# Patient Record
Sex: Female | Born: 2000
Health system: Southern US, Community
[De-identification: ages and names within clinical notes are randomized; demographics above are authoritative.]

## PROBLEM LIST (undated history)

## (undated) DIAGNOSIS — F419 Anxiety disorder, unspecified: Secondary | ICD-10-CM

## (undated) DIAGNOSIS — F909 Attention-deficit hyperactivity disorder, unspecified type: Secondary | ICD-10-CM

## (undated) HISTORY — PX: TYMPANOSTOMY TUBE PLACEMENT: SHX32

## (undated) HISTORY — PX: TONSILLECTOMY AND ADENOIDECTOMY: SUR1326

## (undated) HISTORY — PX: HERNIA REPAIR: SHX51

## (undated) HISTORY — DX: Anxiety disorder, unspecified: F41.9

---
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
## 2022-01-07 DIAGNOSIS — Z30431 Encounter for routine checking of intrauterine contraceptive device: Secondary | ICD-10-CM | POA: Diagnosis not present

## 2022-01-09 DIAGNOSIS — J3089 Other allergic rhinitis: Secondary | ICD-10-CM | POA: Diagnosis not present

## 2022-01-09 DIAGNOSIS — J301 Allergic rhinitis due to pollen: Secondary | ICD-10-CM | POA: Diagnosis not present

## 2022-01-09 DIAGNOSIS — J3081 Allergic rhinitis due to animal (cat) (dog) hair and dander: Secondary | ICD-10-CM | POA: Diagnosis not present

## 2022-01-16 DIAGNOSIS — J301 Allergic rhinitis due to pollen: Secondary | ICD-10-CM | POA: Diagnosis not present

## 2022-01-16 DIAGNOSIS — J3081 Allergic rhinitis due to animal (cat) (dog) hair and dander: Secondary | ICD-10-CM | POA: Diagnosis not present

## 2022-01-16 DIAGNOSIS — J3089 Other allergic rhinitis: Secondary | ICD-10-CM | POA: Diagnosis not present

## 2022-02-04 DIAGNOSIS — J3089 Other allergic rhinitis: Secondary | ICD-10-CM | POA: Diagnosis not present

## 2022-02-04 DIAGNOSIS — J3081 Allergic rhinitis due to animal (cat) (dog) hair and dander: Secondary | ICD-10-CM | POA: Diagnosis not present

## 2022-02-04 DIAGNOSIS — J301 Allergic rhinitis due to pollen: Secondary | ICD-10-CM | POA: Diagnosis not present

## 2022-02-10 DIAGNOSIS — Z01419 Encounter for gynecological examination (general) (routine) without abnormal findings: Secondary | ICD-10-CM | POA: Diagnosis not present

## 2022-02-10 DIAGNOSIS — Z113 Encounter for screening for infections with a predominantly sexual mode of transmission: Secondary | ICD-10-CM | POA: Diagnosis not present

## 2022-02-10 DIAGNOSIS — Z6822 Body mass index (BMI) 22.0-22.9, adult: Secondary | ICD-10-CM | POA: Diagnosis not present

## 2022-02-10 DIAGNOSIS — L709 Acne, unspecified: Secondary | ICD-10-CM | POA: Diagnosis not present

## 2022-03-04 ENCOUNTER — Telehealth: Payer: Self-pay | Admitting: Family Medicine

## 2022-03-04 DIAGNOSIS — Z111 Encounter for screening for respiratory tuberculosis: Secondary | ICD-10-CM

## 2022-03-04 NOTE — Telephone Encounter (Signed)
Patient mother dropped off form to be filled out Patient mother knows that there is a CPE appt in July for daughter  They would like to have it filled out before then so that way they can meet a dead line for a contest with college

## 2022-03-04 NOTE — Telephone Encounter (Signed)
Form has been received. Will she need to come in before July for this to be completed?

## 2022-03-05 NOTE — Telephone Encounter (Signed)
Just needed shot records- has been completed and put up front for pt

## 2022-03-06 DIAGNOSIS — J3081 Allergic rhinitis due to animal (cat) (dog) hair and dander: Secondary | ICD-10-CM | POA: Diagnosis not present

## 2022-03-06 DIAGNOSIS — J3089 Other allergic rhinitis: Secondary | ICD-10-CM | POA: Diagnosis not present

## 2022-03-06 DIAGNOSIS — J301 Allergic rhinitis due to pollen: Secondary | ICD-10-CM | POA: Diagnosis not present

## 2022-03-09 DIAGNOSIS — H6692 Otitis media, unspecified, left ear: Secondary | ICD-10-CM | POA: Diagnosis not present

## 2022-03-19 ENCOUNTER — Other Ambulatory Visit: Payer: Self-pay

## 2022-03-19 ENCOUNTER — Ambulatory Visit (INDEPENDENT_AMBULATORY_CARE_PROVIDER_SITE_OTHER): Payer: BC Managed Care – PPO

## 2022-03-19 ENCOUNTER — Ambulatory Visit: Payer: BC Managed Care – PPO

## 2022-03-19 DIAGNOSIS — Z111 Encounter for screening for respiratory tuberculosis: Secondary | ICD-10-CM

## 2022-03-19 DIAGNOSIS — J3089 Other allergic rhinitis: Secondary | ICD-10-CM | POA: Diagnosis not present

## 2022-03-19 DIAGNOSIS — Z0000 Encounter for general adult medical examination without abnormal findings: Secondary | ICD-10-CM

## 2022-03-19 DIAGNOSIS — J3081 Allergic rhinitis due to animal (cat) (dog) hair and dander: Secondary | ICD-10-CM | POA: Diagnosis not present

## 2022-03-19 DIAGNOSIS — J301 Allergic rhinitis due to pollen: Secondary | ICD-10-CM | POA: Diagnosis not present

## 2022-03-19 NOTE — Addendum Note (Signed)
Addended by: Abbe Amsterdam C on: 03/19/2022 10:02 AM   Modules accepted: Orders

## 2022-03-19 NOTE — Telephone Encounter (Signed)
Patient mother picked up forms and took to college. Looks like they are actually needing a TB test done and is needing it to be done by July1st  Is there anyway we can get this done for them????

## 2022-03-19 NOTE — Telephone Encounter (Signed)
I called her mom- she is coming in right now for a TB skin test

## 2022-03-19 NOTE — Progress Notes (Signed)
PPD Placement note Tamara Wilcox, 21 y.o. female is here today for placement of PPD test Reason for PPD test: Patient mother picked up forms and took to college. Looks like they are actually needing a TB test done and is needing it to be done by July1st Pt taken PPD test before: no Verified in allergy area and with patient that they are not allergic to the products PPD is made of (Phenol or Tween). Yes Is patient taking any oral or IV steroid medication now or have they taken it in the last month? no Has the patient ever received the BCG vaccine?: no Has the patient been in recent contact with anyone known or suspected of having active TB disease?: no      Date of exposure (if applicable):       Name of person they were exposed to (if applicable):  Patient's Country of origin?:  O: Alert and oriented in NAD. P:  PPD placed on 03/19/2022.  Patient advised to return for reading within 48-72 hours.

## 2022-03-21 ENCOUNTER — Ambulatory Visit: Payer: BC Managed Care – PPO

## 2022-03-21 DIAGNOSIS — H9202 Otalgia, left ear: Secondary | ICD-10-CM | POA: Diagnosis not present

## 2022-03-21 DIAGNOSIS — Z111 Encounter for screening for respiratory tuberculosis: Secondary | ICD-10-CM

## 2022-03-21 NOTE — Progress Notes (Signed)
PPD Reading Note PPD read and results entered in [employer]. Result: 0.0 mm induration. Interpretation: Negative If test not read within 48-72 hours of initial placement, patient advised to repeat in other arm 1-3 weeks after this test. Allergic reaction: no   Dr Patsy Lager not in office, Vernona Rieger to [place].

## 2022-03-27 DIAGNOSIS — J3089 Other allergic rhinitis: Secondary | ICD-10-CM | POA: Diagnosis not present

## 2022-03-27 DIAGNOSIS — J301 Allergic rhinitis due to pollen: Secondary | ICD-10-CM | POA: Diagnosis not present

## 2022-03-27 DIAGNOSIS — J3081 Allergic rhinitis due to animal (cat) (dog) hair and dander: Secondary | ICD-10-CM | POA: Diagnosis not present

## 2022-03-31 DIAGNOSIS — J301 Allergic rhinitis due to pollen: Secondary | ICD-10-CM | POA: Diagnosis not present

## 2022-03-31 DIAGNOSIS — J3081 Allergic rhinitis due to animal (cat) (dog) hair and dander: Secondary | ICD-10-CM | POA: Diagnosis not present

## 2022-03-31 DIAGNOSIS — J3089 Other allergic rhinitis: Secondary | ICD-10-CM | POA: Diagnosis not present

## 2022-04-06 NOTE — Progress Notes (Unsigned)
[facility] [address], [address] [place], [place] [address] [phone]  Date:  04/09/2022   Name:  Tamara Wilcox   DOB:  01/07/2001   MRN:  [id number]  PCP:  Pearline Cables, MD    Chief Complaint: No chief complaint on file.   History of Present Illness:  Tamara Wilcox is a 21 y.o. very pleasant female patient who presents with the following:  Patient seen today for physical exam Generally good health, history of ADD treated with Adderall XR Also history of allergies, treated with Singulair  I saw her most recently about 1 year ago, earlier this summer completed her college physical form She graduated from [employer] high school She has seen cardiology previously for history of vasovagal presyncope, here for full activity  Review immunizations  Patient Active Problem List   Diagnosis Date Noted   Acne vulgaris 08/12/2021   Complication of right ear piercing 08/12/2021   Encounter for immunization 08/12/2021   Lesion of skin of right ear 08/12/2021   Viral warts 08/12/2021   Sinus tachycardia 08/12/2021   Cardiac murmur 08/12/2021   History of recurrent ear infection 11/15/2020   Recurrent epistaxis 11/15/2020   Allergic rhinitis 04/24/2016   Gastroesophageal reflux disease 01/02/2016   Oppositional defiant disorder 10/29/2015   Anxiety 10/29/2015   Attention deficit disorder with hyperactivity(314.01) 09/02/2013    Past Medical History:  Diagnosis Date   Allergic rhinitis 04/24/2016   Anxiety    Attention deficit disorder with hyperactivity(314.01) 09/02/2013   Gastroesophageal reflux disease 01/02/2016   History of recurrent ear infection 11/15/2020   Oppositional defiant disorder 10/29/2015   Recurrent epistaxis 11/15/2020    Past Surgical History:  Procedure Laterality Date   HERNIA REPAIR     TONSILLECTOMY AND ADENOIDECTOMY     TYMPANOSTOMY TUBE PLACEMENT      Social History   Tobacco Use    Smoking status: Never   Smokeless tobacco: Never  Substance Use Topics   Alcohol use: No    Alcohol/week: 0.0 standard drinks of alcohol   Drug use: No    Family History  Problem Relation Age of Onset   Thyroid disease Mother    GER disease Mother    Hypertension Father    GER disease Father    Thyroid disease Maternal Grandmother    Osteoporosis Maternal Grandmother    Heart attack Maternal Grandfather    Cancer - Lung Paternal Grandmother    Heart Problems Paternal Grandmother    Cancer - Colon Paternal Grandfather    Cancer Paternal Aunt     Allergies  Allergen Reactions   Avocado    Peanuts [Peanut Oil]     hives   Shrimp (Diagnostic) Other (See Comments)    vomiting    Medication list has been reviewed and updated.  Current Outpatient Medications on File Prior to Visit  Medication Sig Dispense Refill   amphetamine-dextroamphetamine (ADDERALL XR) 30 MG 24 hr capsule Take 30 mg by mouth every morning.     EPIPEN 2-PAK 0.3 MG/0.3ML SOAJ injection Inject 0.3 mg into the muscle as needed for anaphylaxis.  1   levocetirizine (XYZAL) 5 MG tablet Take 2.5 mg by mouth daily.  5   levonorgestrel-ethinyl estradiol (SRONYX) 0.1-20 MG-MCG tablet Take 1 tablet by mouth daily. 84 tablet 3   mirtazapine (REMERON) 30 MG tablet Take 2 tablets by mouth at bedtime.     montelukast (SINGULAIR) 10 MG  tablet Take 10 mg by mouth daily.     Multiple Vitamins-Minerals (ZINC PO) Take 1 capsule by mouth daily.     No current facility-administered medications on file prior to visit.    Review of Systems:  As per HPI- otherwise negative.   Physical Examination: There were no vitals filed for this visit. There were no vitals filed for this visit. There is no height or weight on file to calculate BMI. Ideal Body Weight:    GEN: no acute distress. HEENT: Atraumatic, Normocephalic.  Ears and Nose: No external deformity. CV: RRR, No M/G/R. No JVD. No thrill. No extra heart  sounds. PULM: CTA B, no wheezes, crackles, rhonchi. No retractions. No resp. distress. No accessory muscle use. ABD: S, NT, ND, +BS. No rebound. No HSM. EXTR: No c/c/e PSYCH: Normally interactive. Conversant.    Assessment and Plan: ***  Signed Abbe Amsterdam, MD

## 2022-04-09 ENCOUNTER — Ambulatory Visit (INDEPENDENT_AMBULATORY_CARE_PROVIDER_SITE_OTHER): Payer: BC Managed Care – PPO | Admitting: Family Medicine

## 2022-04-09 VITALS — BP 100/60 | HR 83 | Temp 98.2°F | Resp 18 | Ht 61.75 in | Wt 128.8 lb

## 2022-04-09 DIAGNOSIS — J309 Allergic rhinitis, unspecified: Secondary | ICD-10-CM | POA: Diagnosis not present

## 2022-04-09 DIAGNOSIS — N920 Excessive and frequent menstruation with regular cycle: Secondary | ICD-10-CM | POA: Diagnosis not present

## 2022-04-09 DIAGNOSIS — Z0000 Encounter for general adult medical examination without abnormal findings: Secondary | ICD-10-CM

## 2022-04-09 DIAGNOSIS — Z23 Encounter for immunization: Secondary | ICD-10-CM | POA: Diagnosis not present

## 2022-04-09 DIAGNOSIS — J3081 Allergic rhinitis due to animal (cat) (dog) hair and dander: Secondary | ICD-10-CM | POA: Diagnosis not present

## 2022-04-09 DIAGNOSIS — J3089 Other allergic rhinitis: Secondary | ICD-10-CM | POA: Diagnosis not present

## 2022-04-09 DIAGNOSIS — J301 Allergic rhinitis due to pollen: Secondary | ICD-10-CM | POA: Diagnosis not present

## 2022-04-09 MED ORDER — MONTELUKAST SODIUM 10 MG PO TABS: 10.0000 mg | ORAL_TABLET | Freq: Every day | ORAL | 3 refills | Status: DC

## 2022-04-09 MED ORDER — LEVONORGESTREL-ETHINYL ESTRAD 0.1-20 MG-MCG PO TABS: 1.0000 | ORAL_TABLET | Freq: Every day | ORAL | 3 refills | Status: DC

## 2022-04-09 NOTE — Patient Instructions (Signed)
It was great to see you today- have a wonderful and safe school year!    Let me know if you need anything Last dose of meningitis B given today

## 2022-04-17 DIAGNOSIS — B070 Plantar wart: Secondary | ICD-10-CM | POA: Diagnosis not present

## 2022-04-17 DIAGNOSIS — D229 Melanocytic nevi, unspecified: Secondary | ICD-10-CM | POA: Diagnosis not present

## 2022-04-24 DIAGNOSIS — J301 Allergic rhinitis due to pollen: Secondary | ICD-10-CM | POA: Diagnosis not present

## 2022-04-24 DIAGNOSIS — J3081 Allergic rhinitis due to animal (cat) (dog) hair and dander: Secondary | ICD-10-CM | POA: Diagnosis not present

## 2022-04-24 DIAGNOSIS — J3089 Other allergic rhinitis: Secondary | ICD-10-CM | POA: Diagnosis not present

## 2022-04-29 DIAGNOSIS — H6983 Other specified disorders of Eustachian tube, bilateral: Secondary | ICD-10-CM | POA: Diagnosis not present

## 2022-04-29 DIAGNOSIS — H6693 Otitis media, unspecified, bilateral: Secondary | ICD-10-CM | POA: Diagnosis not present

## 2022-04-29 DIAGNOSIS — Z0110 Encounter for examination of ears and hearing without abnormal findings: Secondary | ICD-10-CM | POA: Diagnosis not present

## 2022-04-30 DIAGNOSIS — J301 Allergic rhinitis due to pollen: Secondary | ICD-10-CM | POA: Diagnosis not present

## 2022-04-30 DIAGNOSIS — J3081 Allergic rhinitis due to animal (cat) (dog) hair and dander: Secondary | ICD-10-CM | POA: Diagnosis not present

## 2022-04-30 DIAGNOSIS — J3089 Other allergic rhinitis: Secondary | ICD-10-CM | POA: Diagnosis not present

## 2022-05-02 DIAGNOSIS — B078 Other viral warts: Secondary | ICD-10-CM | POA: Diagnosis not present

## 2022-05-09 DIAGNOSIS — J3081 Allergic rhinitis due to animal (cat) (dog) hair and dander: Secondary | ICD-10-CM | POA: Diagnosis not present

## 2022-05-09 DIAGNOSIS — J3089 Other allergic rhinitis: Secondary | ICD-10-CM | POA: Diagnosis not present

## 2022-05-09 DIAGNOSIS — J301 Allergic rhinitis due to pollen: Secondary | ICD-10-CM | POA: Diagnosis not present

## 2022-05-29 DIAGNOSIS — R110 Nausea: Secondary | ICD-10-CM | POA: Diagnosis not present

## 2022-05-29 DIAGNOSIS — R197 Diarrhea, unspecified: Secondary | ICD-10-CM | POA: Diagnosis not present

## 2022-05-29 DIAGNOSIS — R52 Pain, unspecified: Secondary | ICD-10-CM | POA: Diagnosis not present

## 2022-05-29 DIAGNOSIS — R1084 Generalized abdominal pain: Secondary | ICD-10-CM | POA: Diagnosis not present

## 2022-06-01 DIAGNOSIS — U071 COVID-19: Secondary | ICD-10-CM | POA: Diagnosis not present

## 2022-06-01 DIAGNOSIS — R0981 Nasal congestion: Secondary | ICD-10-CM | POA: Diagnosis not present

## 2022-07-28 DIAGNOSIS — M25562 Pain in left knee: Secondary | ICD-10-CM | POA: Diagnosis not present

## 2022-07-31 DIAGNOSIS — M25511 Pain in right shoulder: Secondary | ICD-10-CM | POA: Diagnosis not present

## 2022-07-31 DIAGNOSIS — M25311 Other instability, right shoulder: Secondary | ICD-10-CM | POA: Diagnosis not present

## 2022-08-05 DIAGNOSIS — J029 Acute pharyngitis, unspecified: Secondary | ICD-10-CM | POA: Diagnosis not present

## 2022-08-18 DIAGNOSIS — M25511 Pain in right shoulder: Secondary | ICD-10-CM | POA: Diagnosis not present

## 2022-10-06 DIAGNOSIS — M25512 Pain in left shoulder: Secondary | ICD-10-CM | POA: Diagnosis not present

## 2022-10-06 DIAGNOSIS — M7542 Impingement syndrome of left shoulder: Secondary | ICD-10-CM | POA: Diagnosis not present

## 2022-10-08 DIAGNOSIS — N3001 Acute cystitis with hematuria: Secondary | ICD-10-CM | POA: Diagnosis not present

## 2022-10-19 DIAGNOSIS — R933 Abnormal findings on diagnostic imaging of other parts of digestive tract: Secondary | ICD-10-CM | POA: Diagnosis not present

## 2022-10-19 DIAGNOSIS — R109 Unspecified abdominal pain: Secondary | ICD-10-CM | POA: Diagnosis not present

## 2022-10-19 DIAGNOSIS — R112 Nausea with vomiting, unspecified: Secondary | ICD-10-CM | POA: Diagnosis not present

## 2022-10-19 DIAGNOSIS — K529 Noninfective gastroenteritis and colitis, unspecified: Secondary | ICD-10-CM | POA: Diagnosis not present

## 2022-10-21 ENCOUNTER — Telehealth: Payer: Self-pay

## 2022-10-21 NOTE — Telephone Encounter (Signed)
Transition Care Management Unsuccessful Follow-up Telephone Call  Date of discharge and from where:  [facility], 10/19/2022  Attempts:  1st Attempt  Reason for unsuccessful TCM follow-up call:  No answer/busy

## 2022-10-22 NOTE — Telephone Encounter (Signed)
Transition Care Management Unsuccessful Follow-up Telephone Call  Date of discharge and from where:  [facility] 10/19/2022  Attempts:  2nd Attempt  Reason for unsuccessful TCM follow-up call:  No answer/busy

## 2022-10-23 DIAGNOSIS — R1031 Right lower quadrant pain: Secondary | ICD-10-CM | POA: Diagnosis not present

## 2022-10-23 DIAGNOSIS — R109 Unspecified abdominal pain: Secondary | ICD-10-CM | POA: Diagnosis not present

## 2022-10-23 DIAGNOSIS — R7989 Other specified abnormal findings of blood chemistry: Secondary | ICD-10-CM | POA: Diagnosis not present

## 2022-10-23 DIAGNOSIS — R1012 Left upper quadrant pain: Secondary | ICD-10-CM | POA: Diagnosis not present

## 2022-10-23 NOTE — Telephone Encounter (Signed)
Transition Care Management Unsuccessful Follow-up Telephone Call  Date of discharge and from where:  [facility] 09/29/2022  Attempts:  3rd Attempt  Reason for unsuccessful TCM follow-up call:  No answer/busy

## 2022-11-03 DIAGNOSIS — L209 Atopic dermatitis, unspecified: Secondary | ICD-10-CM | POA: Diagnosis not present

## 2022-11-03 DIAGNOSIS — J3089 Other allergic rhinitis: Secondary | ICD-10-CM | POA: Diagnosis not present

## 2022-11-03 DIAGNOSIS — Z91010 Allergy to peanuts: Secondary | ICD-10-CM | POA: Diagnosis not present

## 2022-11-03 DIAGNOSIS — J4530 Mild persistent asthma, uncomplicated: Secondary | ICD-10-CM | POA: Diagnosis not present

## 2022-12-15 DIAGNOSIS — Z113 Encounter for screening for infections with a predominantly sexual mode of transmission: Secondary | ICD-10-CM | POA: Diagnosis not present

## 2022-12-15 DIAGNOSIS — B3731 Acute candidiasis of vulva and vagina: Secondary | ICD-10-CM | POA: Diagnosis not present

## 2022-12-22 DIAGNOSIS — J3089 Other allergic rhinitis: Secondary | ICD-10-CM | POA: Diagnosis not present

## 2022-12-22 DIAGNOSIS — J3081 Allergic rhinitis due to animal (cat) (dog) hair and dander: Secondary | ICD-10-CM | POA: Diagnosis not present

## 2022-12-22 DIAGNOSIS — J301 Allergic rhinitis due to pollen: Secondary | ICD-10-CM | POA: Diagnosis not present

## 2023-01-14 DIAGNOSIS — Z3202 Encounter for pregnancy test, result negative: Secondary | ICD-10-CM | POA: Diagnosis not present

## 2023-02-04 ENCOUNTER — Telehealth: Payer: BC Managed Care – PPO | Admitting: Nurse Practitioner

## 2023-02-04 ENCOUNTER — Encounter: Payer: Self-pay | Admitting: Nurse Practitioner

## 2023-02-04 DIAGNOSIS — N3001 Acute cystitis with hematuria: Secondary | ICD-10-CM | POA: Diagnosis not present

## 2023-02-04 DIAGNOSIS — B3731 Acute candidiasis of vulva and vagina: Secondary | ICD-10-CM | POA: Diagnosis not present

## 2023-02-04 DIAGNOSIS — R10813 Right lower quadrant abdominal tenderness: Secondary | ICD-10-CM | POA: Diagnosis not present

## 2023-02-04 DIAGNOSIS — R1031 Right lower quadrant pain: Secondary | ICD-10-CM | POA: Diagnosis not present

## 2023-02-04 DIAGNOSIS — U071 COVID-19: Secondary | ICD-10-CM

## 2023-02-04 MED ORDER — NIRMATRELVIR/RITONAVIR (PAXLOVID)TABLET
3.0000 | ORAL_TABLET | Freq: Two times a day (BID) | ORAL | 0 refills | Status: AC
Start: 2023-02-04 — End: 2023-02-09

## 2023-02-04 NOTE — Progress Notes (Signed)
Virtual Visit Consent   Tamara Wilcox, you are scheduled for a virtual visit with a [facility] provider today. Just as with appointments in the office, your consent must be obtained to participate. Your consent will be active for this visit and any virtual visit you may have with one of our providers in the next 365 days. If you have a MyChart account, a copy of this consent can be sent to you electronically.  As this is a virtual visit, video technology does not allow for your provider to perform a traditional examination. This may limit your provider's ability to fully assess your condition. If your provider identifies any concerns that need to be evaluated in person or the need to arrange testing (such as labs, EKG, etc.), we will make arrangements to do so. Although advances in technology are sophisticated, we cannot ensure that it will always work on either your end or our end. If the connection with a video visit is poor, the visit may have to be switched to a telephone visit. With either a video or telephone visit, we are not always able to ensure that we have a secure connection.  By engaging in this virtual visit, you consent to the provision of healthcare and authorize for your insurance to be billed (if applicable) for the services provided during this visit. Depending on your insurance coverage, you may receive a charge related to this service.  I need to obtain your verbal consent now. Are you willing to proceed with your visit today? Tamara Wilcox has provided verbal consent on 02/04/2023 for a virtual visit (video or telephone). Tamara Simas, FNP  Date: 02/04/2023 5:56 PM  Virtual Visit via Video Note   I, Tamara Wilcox, connected with  Tamara Wilcox  ([id number], December 07, 2000) on 02/04/23 at  6:00 PM EDT by a video-enabled telemedicine application and verified that I am speaking with the correct person using two identifiers.  Location: Patient: Virtual Visit Location Patient:  Home Provider: Virtual Visit Location Provider: Home Office   I discussed the limitations of evaluation and management by telemedicine and the availability of in person appointments. The patient expressed understanding and agreed to proceed.    History of Present Illness: Tamara Wilcox is a 22 y.o. who identifies as a female who was assigned female at birth, and is being seen today after testing positive for COVID. Tested positive today  Symptom onset was 2 days ago   Symptoms include: Nasal congestion, ear pressure, low grade fever, cough and low grade fever with body aches   She has had COVID once in the past year  Last time she had COVID she took Paxlovid   That was her only episode of COVID  She has been vaccinated for COVID x2   She does have a history of asthma  She does have a rescue inhaler and uses Singulair daily  GFR > 60 10/23/2022    Problems:  Patient Active Problem List   Diagnosis Date Noted   Acne vulgaris 08/12/2021   Complication of right ear piercing 08/12/2021   Encounter for immunization 08/12/2021   Lesion of skin of right ear 08/12/2021   Viral warts 08/12/2021   Sinus tachycardia 08/12/2021   Cardiac murmur 08/12/2021   History of recurrent ear infection 11/15/2020   Recurrent epistaxis 11/15/2020   Allergic rhinitis 04/24/2016   Gastroesophageal reflux disease 01/02/2016   Oppositional defiant disorder 10/29/2015   Anxiety 10/29/2015   Attention deficit disorder with hyperactivity(314.01) 09/02/2013  Allergies:  Allergies  Allergen Reactions   Avocado    Peanuts [Peanut Oil]     hives   Shrimp (Diagnostic) Other (See Comments)    vomiting   Medications:  Current Outpatient Medications:    amphetamine-dextroamphetamine (ADDERALL XR) 30 MG 24 hr capsule, Take 30 mg by mouth every morning., Disp: , Rfl:    EPIPEN 2-PAK 0.3 MG/0.3ML SOAJ injection, Inject 0.3 mg into the muscle as needed for anaphylaxis., Disp: , Rfl: 1    levocetirizine (XYZAL) 5 MG tablet, Take 2.5 mg by mouth daily., Disp: , Rfl: 5   levonorgestrel-ethinyl estradiol (SRONYX) 0.1-20 MG-MCG tablet, Take 1 tablet by mouth daily., Disp: 84 tablet, Rfl: 3   mirtazapine (REMERON) 30 MG tablet, Take 2 tablets by mouth at bedtime., Disp: , Rfl:    montelukast (SINGULAIR) 10 MG tablet, Take 1 tablet (10 mg total) by mouth daily., Disp: 90 tablet, Rfl: 3   Multiple Vitamins-Minerals (ZINC PO), Take 1 capsule by mouth daily., Disp: , Rfl:   Observations/Objective: Patient is well-developed, well-nourished in no acute distress.  Resting comfortably  at home.  Head is normocephalic, atraumatic.  No labored breathing.  Speech is clear and coherent with logical content.  Patient is alert and oriented at baseline.    Assessment and Plan: 1. COVID-19 Discussed options for management  Advised using Albuterol every 4-6 hours as needed  Continue to use over the counter medications as needed for congestion relief as directed    Patient would like to try Paxlovid as she had good outcomes with her prior infection.  Advised this medication  may not be covered by insurance and with or without anti-viral she should follow CDC guidelines for isolation and management of COVID-19 - nirmatrelvir/ritonavir (PAXLOVID) 20 x 150 MG & 10 x 100MG  TABS; Take 3 tablets by mouth 2 (two) times daily for 5 days. (Take nirmatrelvir 150 mg two tablets twice daily for 5 days and ritonavir 100 mg one tablet twice daily for 5 days) Patient GFR is >60  Dispense: 30 tablet; Refill: 0     Follow Up Instructions: I discussed the assessment and treatment plan with the patient. The patient was provided an opportunity to ask questions and all were answered. The patient agreed with the plan and demonstrated an understanding of the instructions.  A copy of instructions were sent to the patient via MyChart unless otherwise noted below.    The patient was advised to call back or seek an  in-person evaluation if the symptoms worsen or if the condition fails to improve as anticipated.  Time:  I spent 15 minutes with the patient via telehealth technology discussing the above problems/concerns.    Tamara Simas, FNP

## 2023-02-09 ENCOUNTER — Telehealth: Payer: BC Managed Care – PPO

## 2023-02-09 ENCOUNTER — Telehealth: Payer: BC Managed Care – PPO | Admitting: Family Medicine

## 2023-02-09 DIAGNOSIS — B9689 Other specified bacterial agents as the cause of diseases classified elsewhere: Secondary | ICD-10-CM

## 2023-02-09 DIAGNOSIS — J019 Acute sinusitis, unspecified: Secondary | ICD-10-CM

## 2023-02-09 MED ORDER — AMOXICILLIN-POT CLAVULANATE 875-125 MG PO TABS
1.0000 | ORAL_TABLET | Freq: Two times a day (BID) | ORAL | 0 refills | Status: AC
Start: 2023-02-09 — End: 2023-02-16

## 2023-02-09 NOTE — Progress Notes (Signed)
Virtual Visit Consent   Tamara Wilcox, you are scheduled for a virtual visit with a [facility] provider today. Just as with appointments in the office, your consent must be obtained to participate. Your consent will be active for this visit and any virtual visit you may have with one of our providers in the next 365 days. If you have a MyChart account, a copy of this consent can be sent to you electronically.  As this is a virtual visit, video technology does not allow for your provider to perform a traditional examination. This may limit your provider's ability to fully assess your condition. If your provider identifies any concerns that need to be evaluated in person or the need to arrange testing (such as labs, EKG, etc.), we will make arrangements to do so. Although advances in technology are sophisticated, we cannot ensure that it will always work on either your end or our end. If the connection with a video visit is poor, the visit may have to be switched to a telephone visit. With either a video or telephone visit, we are not always able to ensure that we have a secure connection.  By engaging in this virtual visit, you consent to the provision of healthcare and authorize for your insurance to be billed (if applicable) for the services provided during this visit. Depending on your insurance coverage, you may receive a charge related to this service.  I need to obtain your verbal consent now. Are you willing to proceed with your visit today? Tamara Wilcox has provided verbal consent on 02/09/2023 for a virtual visit (video or telephone). Freddy Finner, NP  Date: 02/09/2023 3:12 PM  Virtual Visit via Video Note   I, Freddy Finner, connected with  Tamara Wilcox  ([id number], 06-09-2001) on 02/09/23 at  3:15 PM EDT by a video-enabled telemedicine application and verified that I am speaking with the correct person using two identifiers.  Location: Patient: Virtual Visit Location Patient:  Home Provider: Virtual Visit Location Provider: Home   I discussed the limitations of evaluation and management by telemedicine and the availability of in person appointments. The patient expressed understanding and agreed to proceed.    History of Present Illness: Tamara Wilcox is a 22 y.o. who identifies as a female who was assigned female at birth, and is being seen today for sinus infection post covid- having pressure in sinuses and headaches, prone to sinus infections with allergies. Reports loss of taste and smell- smell has improved- is using flonase. Continues to have taste issues. Denies fever chills or chest pain, shortness of breath  Problems:  Patient Active Problem List   Diagnosis Date Noted   Acne vulgaris 08/12/2021   Complication of right ear piercing 08/12/2021   Encounter for immunization 08/12/2021   Lesion of skin of right ear 08/12/2021   Viral warts 08/12/2021   Sinus tachycardia 08/12/2021   Cardiac murmur 08/12/2021   History of recurrent ear infection 11/15/2020   Recurrent epistaxis 11/15/2020   Allergic rhinitis 04/24/2016   Gastroesophageal reflux disease 01/02/2016   Oppositional defiant disorder 10/29/2015   Anxiety 10/29/2015   Attention deficit disorder with hyperactivity(314.01) 09/02/2013    Allergies:  Allergies  Allergen Reactions   Avocado    Peanuts [Peanut Oil]     hives   Shrimp (Diagnostic) Other (See Comments)    vomiting   Medications:  Current Outpatient Medications:    amphetamine-dextroamphetamine (ADDERALL XR) 30 MG 24 hr capsule, Take 30  mg by mouth every morning., Disp: , Rfl:    Cetirizine HCl (ZYRTEC ALLERGY) 10 MG CAPS, Take by mouth., Disp: , Rfl:    EPIPEN 2-PAK 0.3 MG/0.3ML SOAJ injection, Inject 0.3 mg into the muscle as needed for anaphylaxis., Disp: , Rfl: 1   escitalopram (LEXAPRO) 20 MG tablet, Take by mouth., Disp: , Rfl:    mirtazapine (REMERON) 30 MG tablet, Take 2 tablets by mouth at bedtime., Disp: , Rfl:     montelukast (SINGULAIR) 10 MG tablet, Take 1 tablet (10 mg total) by mouth daily., Disp: 90 tablet, Rfl: 3   Multiple Vitamins-Minerals (ZINC PO), Take 1 capsule by mouth daily., Disp: , Rfl:    nirmatrelvir/ritonavir (PAXLOVID) 20 x 150 MG & 10 x 100MG  TABS, Take 3 tablets by mouth 2 (two) times daily for 5 days. (Take nirmatrelvir 150 mg two tablets twice daily for 5 days and ritonavir 100 mg one tablet twice daily for 5 days) Patient GFR is >60, Disp: 30 tablet, Rfl: 0   paragard intrauterine copper IUD IUD, Provided by care center, Disp: , Rfl:   Observations/Objective: Patient is well-developed, well-nourished in no acute distress.  Resting comfortably  at home.  Head is normocephalic, atraumatic.  No labored breathing.  Speech is clear and coherent with logical content.  Patient is alert and oriented at baseline.    Assessment and Plan:  1. Acute bacterial sinusitis  - amoxicillin-clavulanate (AUGMENTIN) 875-125 MG tablet; Take 1 tablet by mouth 2 (two) times daily for 7 days.  Dispense: 14 tablet; Refill: 0  -post covid sinus infection with loss of taste lingering -follow up in person is this is not improved  URI recommendations: - Increased rest - Increasing Fluids - Acetaminophen / ibuprofen as needed for fever/pain.  - Mucinex if mucus is present and increasing.  - Saline nasal spray if congestion or if nasal passages feel dry. - Humidifying the air.   Reviewed side effects, risks and benefits of medication.    Patient acknowledged agreement and understanding of the plan.   Past Medical, Surgical, Social History, Allergies, and Medications have been Reviewed.   Follow Up Instructions: I discussed the assessment and treatment plan with the patient. The patient was provided an opportunity to ask questions and all were answered. The patient agreed with the plan and demonstrated an understanding of the instructions.  A copy of instructions were sent to the patient via  MyChart unless otherwise noted below.    The patient was advised to call back or seek an in-person evaluation if the symptoms worsen or if the condition fails to improve as anticipated.  Time:  I spent 10 minutes with the patient via telehealth technology discussing the above problems/concerns.    Freddy Finner, NP

## 2023-02-09 NOTE — Patient Instructions (Addendum)
Manfred Arch, thank you for joining Freddy Finner, NP for today's virtual visit.  While this provider is not your primary care provider (PCP), if your PCP is located in our provider database this encounter information will be shared with them immediately following your visit.   A [facility] MyChart account gives you access to today's visit and all your visits, tests, and labs performed at [facility] " click here if you don't have a [facility] MyChart account or go to [email or web address]  Consent: (Patient) Tamara Wilcox provided verbal consent for this virtual visit at the beginning of the encounter.  Current Medications:  Current Outpatient Medications:    amoxicillin-clavulanate (AUGMENTIN) 875-125 MG tablet, Take 1 tablet by mouth 2 (two) times daily for 7 days., Disp: 14 tablet, Rfl: 0   amphetamine-dextroamphetamine (ADDERALL XR) 30 MG 24 hr capsule, Take 30 mg by mouth every morning., Disp: , Rfl:    Cetirizine HCl (ZYRTEC ALLERGY) 10 MG CAPS, Take by mouth., Disp: , Rfl:    EPIPEN 2-PAK 0.3 MG/0.3ML SOAJ injection, Inject 0.3 mg into the muscle as needed for anaphylaxis., Disp: , Rfl: 1   escitalopram (LEXAPRO) 20 MG tablet, Take by mouth., Disp: , Rfl:    mirtazapine (REMERON) 30 MG tablet, Take 2 tablets by mouth at bedtime., Disp: , Rfl:    montelukast (SINGULAIR) 10 MG tablet, Take 1 tablet (10 mg total) by mouth daily., Disp: 90 tablet, Rfl: 3   Multiple Vitamins-Minerals (ZINC PO), Take 1 capsule by mouth daily., Disp: , Rfl:    nirmatrelvir/ritonavir (PAXLOVID) 20 x 150 MG & 10 x 100MG  TABS, Take 3 tablets by mouth 2 (two) times daily for 5 days. (Take nirmatrelvir 150 mg two tablets twice daily for 5 days and ritonavir 100 mg one tablet twice daily for 5 days) Patient GFR is >60, Disp: 30 tablet, Rfl: 0   paragard intrauterine copper IUD IUD, Provided by care center, Disp: , Rfl:    Medications ordered in this encounter:  Meds ordered this encounter   Medications   amoxicillin-clavulanate (AUGMENTIN) 875-125 MG tablet    Sig: Take 1 tablet by mouth 2 (two) times daily for 7 days.    Dispense:  14 tablet    Refill:  0    Order Specific Question:   Supervising Provider    Answer:   Merrilee Jansky [id number]     *If you need refills on other medications prior to your next appointment, please contact your pharmacy*  Follow-Up: Call back or seek an in-person evaluation if the symptoms worsen or if the condition fails to improve as anticipated.  [facility] [phone]  Other Instructions  URI recommendations: - take meds as discussed - Increased rest - Increasing Fluids - Acetaminophen / ibuprofen as needed for fever/pain.  - Salt water gargling, chloraseptic spray and throat lozenges - Mucinex if mucus is present and increasing.  - Saline nasal spray if congestion or if nasal passages feel dry. - Humidifying the air.    If you have been instructed to have an in-person evaluation today at a local Urgent Care facility, please use the link below. It will take you to a list of all of our available [facility], including address, phone number and hours of operation. Please do not delay care.  [facility]  If you or a family member do not have a primary care provider, use the link below to schedule a visit and establish  care. When you choose a [facility] primary care physician or advanced practice provider, you gain a long-term partner in health. Find a Primary Care Provider  Learn more about [facility]'s in-office and virtual care options: [facility] - Get Care Now

## 2023-02-10 NOTE — Progress Notes (Deleted)
[facility] [address] [place], [place] [address] [phone]  Date:  02/11/2023   Name:  Tamara Wilcox   DOB:  [birth year]   MRN:  [id number]  PCP:  Pearline Cables, MD    Chief Complaint: No chief complaint on file.   History of Present Illness:  Tamara Wilcox is a 22 y.o. very pleasant female patient who presents with the following:  Patient is seen today with concern of taste and smell changes after recent COVID-19 infection Virtual visit today, patient is at school in [place].  She studies at [employer]  My location is office Patient identity from with 2 factors, the patient and myself are present on the call today  She was seen at [facility] ER on 5/15 with concern of COVID-19 Most recent visit with myself was in July Generally good health, history of ADD treated with Adderall XR Also history of allergies, treated with Singulair  Patient Active Problem List   Diagnosis Date Noted   Acne vulgaris 08/12/2021   Complication of right ear piercing 08/12/2021   Encounter for immunization 08/12/2021   Lesion of skin of right ear 08/12/2021   Viral warts 08/12/2021   Sinus tachycardia 08/12/2021   Cardiac murmur 08/12/2021   History of recurrent ear infection 11/15/2020   Recurrent epistaxis 11/15/2020   Allergic rhinitis 04/24/2016   Gastroesophageal reflux disease 01/02/2016   Oppositional defiant disorder 10/29/2015   Anxiety 10/29/2015   Attention deficit disorder with hyperactivity(314.01) 09/02/2013    Past Medical History:  Diagnosis Date   Allergic rhinitis 04/24/2016   Anxiety    Attention deficit disorder with hyperactivity(314.01) 09/02/2013   Gastroesophageal reflux disease 01/02/2016   History of recurrent ear infection 11/15/2020   Oppositional defiant disorder 10/29/2015   Recurrent epistaxis 11/15/2020    Past Surgical History:  Procedure Laterality Date   HERNIA REPAIR      TONSILLECTOMY AND ADENOIDECTOMY     TYMPANOSTOMY TUBE PLACEMENT      Social History   Tobacco Use   Smoking status: Never   Smokeless tobacco: Never  Substance Use Topics   Alcohol use: No    Alcohol/week: 0.0 standard drinks of alcohol   Drug use: No    Family History  Problem Relation Age of Onset   Thyroid disease Mother    GER disease Mother    Hypertension Father    GER disease Father    Thyroid disease Maternal Grandmother    Osteoporosis Maternal Grandmother    Heart attack Maternal Grandfather    Cancer - Lung Paternal Grandmother    Heart Problems Paternal Grandmother    Cancer - Colon Paternal Grandfather    Cancer Paternal Aunt     Allergies  Allergen Reactions   Avocado    Peanuts [Peanut Oil]     hives   Shrimp (Diagnostic) Other (See Comments)    vomiting    Medication list has been reviewed and updated.  Current Outpatient Medications on File Prior to Visit  Medication Sig Dispense Refill   amoxicillin-clavulanate (AUGMENTIN) 875-125 MG tablet Take 1 tablet by mouth 2 (two) times daily for 7 days. 14 tablet 0   amphetamine-dextroamphetamine (ADDERALL XR) 30 MG 24 hr capsule Take 30 mg by mouth every morning.     Cetirizine HCl (ZYRTEC ALLERGY) 10 MG CAPS Take by mouth.     EPIPEN 2-PAK 0.3 MG/0.3ML SOAJ injection Inject 0.3 mg into the muscle  as needed for anaphylaxis.  1   escitalopram (LEXAPRO) 20 MG tablet Take by mouth.     mirtazapine (REMERON) 30 MG tablet Take 2 tablets by mouth at bedtime.     montelukast (SINGULAIR) 10 MG tablet Take 1 tablet (10 mg total) by mouth daily. 90 tablet 3   Multiple Vitamins-Minerals (ZINC PO) Take 1 capsule by mouth daily.     paragard intrauterine copper IUD IUD Provided by care center     No current facility-administered medications on file prior to visit.    Review of Systems:  As per HPI- otherwise negative.   Physical Examination: There were no vitals filed for this visit. There were no vitals  filed for this visit. There is no height or weight on file to calculate BMI. Ideal Body Weight:      Assessment and Plan: ***  Signed Abbe Amsterdam, MD

## 2023-02-11 ENCOUNTER — Telehealth: Payer: BC Managed Care – PPO | Admitting: Family Medicine

## 2023-03-06 ENCOUNTER — Telehealth: Payer: BC Managed Care – PPO | Admitting: Physician Assistant

## 2023-03-06 DIAGNOSIS — R3989 Other symptoms and signs involving the genitourinary system: Secondary | ICD-10-CM

## 2023-03-06 MED ORDER — CEPHALEXIN 500 MG PO CAPS: 500.0000 mg | ORAL_CAPSULE | Freq: Two times a day (BID) | ORAL | 0 refills | Status: AC

## 2023-03-06 NOTE — Progress Notes (Signed)
Virtual Visit Consent   Tamara Wilcox, you are scheduled for a virtual visit with a [facility] provider today. Just as with appointments in the office, your consent must be obtained to participate. Your consent will be active for this visit and any virtual visit you may have with one of our providers in the next 365 days. If you have a MyChart account, a copy of this consent can be sent to you electronically.  As this is a virtual visit, video technology does not allow for your provider to perform a traditional examination. This may limit your provider's ability to fully assess your condition. If your provider identifies any concerns that need to be evaluated in person or the need to arrange testing (such as labs, EKG, etc.), we will make arrangements to do so. Although advances in technology are sophisticated, we cannot ensure that it will always work on either your end or our end. If the connection with a video visit is poor, the visit may have to be switched to a telephone visit. With either a video or telephone visit, we are not always able to ensure that we have a secure connection.  By engaging in this virtual visit, you consent to the provision of healthcare and authorize for your insurance to be billed (if applicable) for the services provided during this visit. Depending on your insurance coverage, you may receive a charge related to this service.  I need to obtain your verbal consent now. Are you willing to proceed with your visit today? Aleta L Age has provided verbal consent on 03/06/2023 for a virtual visit (video or telephone). Tamara Wilcox, [place]  Date: 03/06/2023 10:50 AM  Virtual Visit via Video Note   I, Tamara Wilcox, connected with  RORIE YUEN  ([id number], 09/13/2001) on 03/06/23 at 10:30 AM EDT by a video-enabled telemedicine application and verified that I am speaking with the correct person using two identifiers.  Location: Patient: Virtual Visit Location  Patient: Home Provider: Virtual Visit Location Provider: Home Office   I discussed the limitations of evaluation and management by telemedicine and the availability of in person appointments. The patient expressed understanding and agreed to proceed.    History of Present Illness: Tamara Wilcox is a 22 y.o. who identifies as a female who was assigned female at birth, and is being seen today for possible UTI .Symptoms started this morning with dysuria, urgency, frequency and low back pain. Noting some fatigue. Denies flank pain. Some suprapubic pressure. LMP -- ended 5/25. Paraguard in place.   HPI: HPI  Problems:  Patient Active Problem List   Diagnosis Date Noted   Acne vulgaris 08/12/2021   Complication of right ear piercing 08/12/2021   Encounter for immunization 08/12/2021   Lesion of skin of right ear 08/12/2021   Viral warts 08/12/2021   Sinus tachycardia 08/12/2021   Cardiac murmur 08/12/2021   History of recurrent ear infection 11/15/2020   Recurrent epistaxis 11/15/2020   Allergic rhinitis 04/24/2016   Gastroesophageal reflux disease 01/02/2016   Oppositional defiant disorder 10/29/2015   Anxiety 10/29/2015   Attention deficit disorder with hyperactivity(314.01) 09/02/2013    Allergies:  Allergies  Allergen Reactions   Avocado    Peanuts [Peanut Oil]     hives   Shrimp (Diagnostic) Other (See Comments)    vomiting   Medications:  Current Outpatient Medications:    cephALEXin (KEFLEX) 500 MG capsule, Take 1 capsule (500 mg total) by mouth 2 (two) times daily for  7 days., Disp: 14 capsule, Rfl: 0   amphetamine-dextroamphetamine (ADDERALL XR) 30 MG 24 hr capsule, Take 30 mg by mouth every morning., Disp: , Rfl:    Cetirizine HCl (ZYRTEC ALLERGY) 10 MG CAPS, Take by mouth., Disp: , Rfl:    EPIPEN 2-PAK 0.3 MG/0.3ML SOAJ injection, Inject 0.3 mg into the muscle as needed for anaphylaxis., Disp: , Rfl: 1   escitalopram (LEXAPRO) 20 MG tablet, Take by mouth., Disp: , Rfl:     mirtazapine (REMERON) 30 MG tablet, Take 2 tablets by mouth at bedtime., Disp: , Rfl:    montelukast (SINGULAIR) 10 MG tablet, Take 1 tablet (10 mg total) by mouth daily., Disp: 90 tablet, Rfl: 3   Multiple Vitamins-Minerals (ZINC PO), Take 1 capsule by mouth daily., Disp: , Rfl:    paragard intrauterine copper IUD IUD, Provided by care center, Disp: , Rfl:   Observations/Objective: Patient is well-developed, well-nourished in no acute distress.  Resting comfortably at home.  Head is normocephalic, atraumatic.  No labored breathing. Speech is clear and coherent with logical content.  Patient is alert and oriented at baseline.   Assessment and Plan: 1. Suspected UTI - cephALEXin (KEFLEX) 500 MG capsule; Take 1 capsule (500 mg total) by mouth 2 (two) times daily for 7 days.  Dispense: 14 capsule; Refill: 0  Classic UTI symptoms with absence of alarm signs or symptoms. Prior history of UTI. Will treat empirically with Keflex for suspected uncomplicated cystitis. Supportive measures and OTC medications reviewed. Strict in-person evaluation precautions discussed.    Follow Up Instructions: I discussed the assessment and treatment plan with the patient. The patient was provided an opportunity to ask questions and all were answered. The patient agreed with the plan and demonstrated an understanding of the instructions.  A copy of instructions were sent to the patient via MyChart unless otherwise noted below.   The patient was advised to call back or seek an in-person evaluation if the symptoms worsen or if the condition fails to improve as anticipated.  Time:  I spent 10 minutes with the patient via telehealth technology discussing the above problems/concerns.    Tamara Climes, PA-C

## 2023-03-06 NOTE — Patient Instructions (Signed)
Manfred Arch, thank you for joining Piedad Climes, PA-C for today's virtual visit.  While this provider is not your primary care provider (PCP), if your PCP is located in our provider database this encounter information will be shared with them immediately following your visit.   A [facility] MyChart account gives you access to today's visit and all your visits, tests, and labs performed at [facility] " click here if you don't have a [facility] MyChart account or go to [email or web address]  Consent: (Patient) Tamara Wilcox provided verbal consent for this virtual visit at the beginning of the encounter.  Current Medications:  Current Outpatient Medications:    amphetamine-dextroamphetamine (ADDERALL XR) 30 MG 24 hr capsule, Take 30 mg by mouth every morning., Disp: , Rfl:    Cetirizine HCl (ZYRTEC ALLERGY) 10 MG CAPS, Take by mouth., Disp: , Rfl:    EPIPEN 2-PAK 0.3 MG/0.3ML SOAJ injection, Inject 0.3 mg into the muscle as needed for anaphylaxis., Disp: , Rfl: 1   escitalopram (LEXAPRO) 20 MG tablet, Take by mouth., Disp: , Rfl:    mirtazapine (REMERON) 30 MG tablet, Take 2 tablets by mouth at bedtime., Disp: , Rfl:    montelukast (SINGULAIR) 10 MG tablet, Take 1 tablet (10 mg total) by mouth daily., Disp: 90 tablet, Rfl: 3   Multiple Vitamins-Minerals (ZINC PO), Take 1 capsule by mouth daily., Disp: , Rfl:    paragard intrauterine copper IUD IUD, Provided by care center, Disp: , Rfl:    Medications ordered in this encounter:  No orders of the defined types were placed in this encounter.    *If you need refills on other medications prior to your next appointment, please contact your pharmacy*  Follow-Up: Call back or seek an in-person evaluation if the symptoms worsen or if the condition fails to improve as anticipated.  [facility] [phone]  Other Instructions Your symptoms are consistent with a bladder infection, also called acute  cystitis. Please take your antibiotic (Keflex) as directed until all pills are gone.  Stay very well hydrated.  Consider a daily probiotic (Align, Culturelle, or Activia) to help prevent stomach upset caused by the antibiotic.  Taking a probiotic daily may also help prevent recurrent UTIs.  Also consider taking AZO (Phenazopyridine) tablets to help decrease pain with urination.   Urinary Tract Infection A urinary tract infection (UTI) can occur any place along the urinary tract. The tract includes the kidneys, ureters, bladder, and urethra. A type of germ called bacteria often causes a UTI. UTIs are often helped with antibiotic medicine.  HOME CARE  If given, take antibiotics as told by your doctor. Finish them even if you start to feel better. Drink enough fluids to keep your pee (urine) clear or pale yellow. Avoid tea, drinks with caffeine, and bubbly (carbonated) drinks. Pee often. Avoid holding your pee in for a long time. Pee before and after having sex (intercourse). Wipe from front to back after you poop (bowel movement) if you are a woman. Use each tissue only once. GET HELP RIGHT AWAY IF:  You have back pain. You have lower belly (abdominal) pain. You have chills. You feel sick to your stomach (nauseous). You throw up (vomit). Your burning or discomfort with peeing does not go away. You have a fever. Your symptoms are not better in 3 days. MAKE SURE YOU:  Understand these instructions. Will watch your condition. Will get help right away if you are not doing well or get  worse. Document Released: 02/25/2008 Document Revised: 06/02/2012 Document Reviewed: 04/08/2012 [facility] Patient Information 2015 [place], [place]. This information is not intended to replace advice given to you by your health care provider. Make sure you discuss any questions you have with your health care provider.    If you have been instructed to have an in-person evaluation today at a local Urgent Care  facility, please use the link below. It will take you to a list of all of our available [facility], including address, phone number and hours of operation. Please do not delay care.  [facility]  If you or a family member do not have a primary care provider, use the link below to schedule a visit and establish care. When you choose a [facility] primary care physician or advanced practice provider, you gain a long-term partner in health. Find a Primary Care Provider  Learn more about [facility]'s in-office and virtual care options: [facility] - Get Care Now

## 2023-03-19 DIAGNOSIS — Z6824 Body mass index (BMI) 24.0-24.9, adult: Secondary | ICD-10-CM | POA: Diagnosis not present

## 2023-03-19 DIAGNOSIS — N390 Urinary tract infection, site not specified: Secondary | ICD-10-CM | POA: Diagnosis not present

## 2023-03-19 DIAGNOSIS — Z113 Encounter for screening for infections with a predominantly sexual mode of transmission: Secondary | ICD-10-CM | POA: Diagnosis not present

## 2023-03-19 DIAGNOSIS — N760 Acute vaginitis: Secondary | ICD-10-CM | POA: Diagnosis not present

## 2023-03-19 DIAGNOSIS — Z01419 Encounter for gynecological examination (general) (routine) without abnormal findings: Secondary | ICD-10-CM | POA: Diagnosis not present

## 2023-03-30 DIAGNOSIS — N760 Acute vaginitis: Secondary | ICD-10-CM | POA: Diagnosis not present

## 2023-03-30 DIAGNOSIS — R300 Dysuria: Secondary | ICD-10-CM | POA: Diagnosis not present

## 2023-04-10 DIAGNOSIS — N941 Unspecified dyspareunia: Secondary | ICD-10-CM | POA: Diagnosis not present

## 2023-04-10 DIAGNOSIS — N760 Acute vaginitis: Secondary | ICD-10-CM | POA: Diagnosis not present

## 2023-04-16 ENCOUNTER — Telehealth: Payer: Self-pay | Admitting: Family Medicine

## 2023-04-16 NOTE — Telephone Encounter (Signed)
Forms received- in folder for completion.

## 2023-04-16 NOTE — Telephone Encounter (Signed)
Pt's mom dropped off paperwork for pt school sine appointment is next week. Placed forms in PCP's box in front office.

## 2023-04-19 NOTE — Progress Notes (Unsigned)
[facility] [address] [place], [place] [address] [phone]  Date:  04/20/2023   Name:  CIERRE PALO   DOB:  [birth year]   MRN:  [id number]  PCP:  Pearline Cables, MD    Chief Complaint: No chief complaint on file.   History of Present Illness:  Alyshia L Muska is a 22 y.o. very pleasant female patient who presents with the following:  Patient seen today for physical exam for school Most recent visit with myself was about 1 year ago also for physical  History of vasovagal presyncope, she was previously evaluated by cardiology and released to full activity  Patient Active Problem List   Diagnosis Date Noted   Acne vulgaris 08/12/2021   Complication of right ear piercing 08/12/2021   Encounter for immunization 08/12/2021   Lesion of skin of right ear 08/12/2021   Viral warts 08/12/2021   Sinus tachycardia 08/12/2021   Cardiac murmur 08/12/2021   History of recurrent ear infection 11/15/2020   Recurrent epistaxis 11/15/2020   Allergic rhinitis 04/24/2016   Gastroesophageal reflux disease 01/02/2016   Oppositional defiant disorder 10/29/2015   Anxiety 10/29/2015   Attention deficit disorder with hyperactivity(314.01) 09/02/2013    Past Medical History:  Diagnosis Date   Allergic rhinitis 04/24/2016   Anxiety    Attention deficit disorder with hyperactivity(314.01) 09/02/2013   Gastroesophageal reflux disease 01/02/2016   History of recurrent ear infection 11/15/2020   Oppositional defiant disorder 10/29/2015   Recurrent epistaxis 11/15/2020    Past Surgical History:  Procedure Laterality Date   HERNIA REPAIR     TONSILLECTOMY AND ADENOIDECTOMY     TYMPANOSTOMY TUBE PLACEMENT      Social History   Tobacco Use   Smoking status: Never   Smokeless tobacco: Never  Substance Use Topics   Alcohol use: No    Alcohol/week: 0.0 standard drinks of alcohol   Drug use: No    Family History  Problem  Relation Age of Onset   Thyroid disease Mother    GER disease Mother    Hypertension Father    GER disease Father    Thyroid disease Maternal Grandmother    Osteoporosis Maternal Grandmother    Heart attack Maternal Grandfather    Cancer - Lung Paternal Grandmother    Heart Problems Paternal Grandmother    Cancer - Colon Paternal Grandfather    Cancer Paternal Aunt     Allergies  Allergen Reactions   Avocado    Peanuts [Peanut Oil]     hives   Shrimp (Diagnostic) Other (See Comments)    vomiting    Medication list has been reviewed and updated.  Current Outpatient Medications on File Prior to Visit  Medication Sig Dispense Refill   amphetamine-dextroamphetamine (ADDERALL XR) 30 MG 24 hr capsule Take 30 mg by mouth every morning.     Cetirizine HCl (ZYRTEC ALLERGY) 10 MG CAPS Take by mouth.     EPIPEN 2-PAK 0.3 MG/0.3ML SOAJ injection Inject 0.3 mg into the muscle as needed for anaphylaxis.  1   escitalopram (LEXAPRO) 20 MG tablet Take by mouth.     mirtazapine (REMERON) 30 MG tablet Take 2 tablets by mouth at bedtime.     montelukast (SINGULAIR) 10 MG tablet Take 1 tablet (10 mg total) by mouth daily. 90 tablet 3   Multiple Vitamins-Minerals (ZINC PO) Take 1 capsule by mouth daily.     paragard intrauterine copper IUD IUD  Provided by care center     No current facility-administered medications on file prior to visit.    Review of Systems:  As per HPI- otherwise negative.   Physical Examination: There were no vitals filed for this visit. There were no vitals filed for this visit. There is no height or weight on file to calculate BMI. Ideal Body Weight:    GEN: no acute distress. HEENT: Atraumatic, Normocephalic.  Ears and Nose: No external deformity. CV: RRR, No M/G/R. No JVD. No thrill. No extra heart sounds. PULM: CTA B, no wheezes, crackles, rhonchi. No retractions. No resp. distress. No accessory muscle use. ABD: S, NT, ND, +BS. No rebound. No HSM. EXTR: No  c/c/e PSYCH: Normally interactive. Conversant.    Assessment and Plan: ***  Signed Abbe Amsterdam, MD

## 2023-04-20 ENCOUNTER — Encounter: Payer: Self-pay | Admitting: Family Medicine

## 2023-04-20 ENCOUNTER — Ambulatory Visit (INDEPENDENT_AMBULATORY_CARE_PROVIDER_SITE_OTHER): Payer: BC Managed Care – PPO | Admitting: Family Medicine

## 2023-04-20 VITALS — BP 118/70 | HR 85 | Temp 97.8°F | Resp 18 | Ht 60.0 in | Wt 137.4 lb

## 2023-04-20 DIAGNOSIS — Z111 Encounter for screening for respiratory tuberculosis: Secondary | ICD-10-CM

## 2023-04-20 DIAGNOSIS — Z23 Encounter for immunization: Secondary | ICD-10-CM

## 2023-04-20 DIAGNOSIS — Z1329 Encounter for screening for other suspected endocrine disorder: Secondary | ICD-10-CM | POA: Diagnosis not present

## 2023-04-20 DIAGNOSIS — Z130 Encounter for screening for diseases of the blood and blood-forming organs and certain disorders involving the immune mechanism: Secondary | ICD-10-CM

## 2023-04-20 DIAGNOSIS — Z13220 Encounter for screening for lipoid disorders: Secondary | ICD-10-CM | POA: Diagnosis not present

## 2023-04-20 DIAGNOSIS — Z0000 Encounter for general adult medical examination without abnormal findings: Secondary | ICD-10-CM

## 2023-04-20 DIAGNOSIS — Z131 Encounter for screening for diabetes mellitus: Secondary | ICD-10-CM | POA: Diagnosis not present

## 2023-04-20 DIAGNOSIS — Z7721 Contact with and (suspected) exposure to potentially hazardous body fluids: Secondary | ICD-10-CM | POA: Diagnosis not present

## 2023-04-20 NOTE — Patient Instructions (Signed)
It was great to see you, Best of luck with your school program!   I will be in touch with your labs ASAP

## 2023-04-21 ENCOUNTER — Encounter: Payer: Self-pay | Admitting: Family Medicine

## 2023-04-22 ENCOUNTER — Encounter: Payer: Self-pay | Admitting: Family Medicine

## 2023-05-14 DIAGNOSIS — R300 Dysuria: Secondary | ICD-10-CM | POA: Diagnosis not present

## 2023-05-14 DIAGNOSIS — N390 Urinary tract infection, site not specified: Secondary | ICD-10-CM | POA: Diagnosis not present

## 2023-05-18 DIAGNOSIS — N390 Urinary tract infection, site not specified: Secondary | ICD-10-CM | POA: Diagnosis not present

## 2023-05-18 DIAGNOSIS — N761 Subacute and chronic vaginitis: Secondary | ICD-10-CM | POA: Diagnosis not present

## 2023-06-08 DIAGNOSIS — N761 Subacute and chronic vaginitis: Secondary | ICD-10-CM | POA: Diagnosis not present

## 2023-06-08 DIAGNOSIS — R300 Dysuria: Secondary | ICD-10-CM | POA: Diagnosis not present

## 2023-06-11 DIAGNOSIS — J4540 Moderate persistent asthma, uncomplicated: Secondary | ICD-10-CM | POA: Diagnosis not present

## 2023-06-11 DIAGNOSIS — J069 Acute upper respiratory infection, unspecified: Secondary | ICD-10-CM | POA: Diagnosis not present

## 2023-06-11 DIAGNOSIS — R0981 Nasal congestion: Secondary | ICD-10-CM | POA: Diagnosis not present

## 2023-06-22 DIAGNOSIS — N6452 Nipple discharge: Secondary | ICD-10-CM | POA: Diagnosis not present

## 2023-08-05 DIAGNOSIS — N926 Irregular menstruation, unspecified: Secondary | ICD-10-CM | POA: Diagnosis not present

## 2023-08-05 DIAGNOSIS — Z8742 Personal history of other diseases of the female genital tract: Secondary | ICD-10-CM | POA: Diagnosis not present

## 2023-08-05 DIAGNOSIS — N760 Acute vaginitis: Secondary | ICD-10-CM | POA: Diagnosis not present

## 2023-08-27 ENCOUNTER — Telehealth: Payer: BC Managed Care – PPO | Admitting: Physician Assistant

## 2023-08-27 DIAGNOSIS — B379 Candidiasis, unspecified: Secondary | ICD-10-CM

## 2023-08-27 DIAGNOSIS — B9689 Other specified bacterial agents as the cause of diseases classified elsewhere: Secondary | ICD-10-CM

## 2023-08-27 MED ORDER — FLUCONAZOLE 150 MG PO TABS
ORAL_TABLET | ORAL | 0 refills | Status: DC
Start: 2023-08-27 — End: 2023-09-08

## 2023-08-27 MED ORDER — CLINDAMYCIN PHOSPHATE 2 % VA CREA: 1.0000 | TOPICAL_CREAM | Freq: Every day | VAGINAL | 0 refills | Status: DC

## 2023-08-27 NOTE — Progress Notes (Signed)

## 2023-08-27 NOTE — Progress Notes (Signed)
E-Visit for Vaginal Symptoms  We are sorry that you are not feeling well. Here is how we plan to help! Based on what you shared with me it looks like you: May have a vaginosis due to bacteria  Vaginosis is an inflammation of the vagina that can result in discharge, itching and pain. The cause is usually a change in the normal balance of vaginal bacteria or an infection. Vaginosis can also result from reduced estrogen levels after menopause.  The most common causes of vaginosis are:   Bacterial vaginosis which results from an overgrowth of one on several organisms that are normally present in your vagina.   Yeast infections which are caused by a naturally occurring fungus called candida.   Vaginal atrophy (atrophic vaginosis) which results from the thinning of the vagina from reduced estrogen levels after menopause.   Trichomoniasis which is caused by a parasite and is commonly transmitted by sexual intercourse.  Factors that increase your risk of developing vaginosis include: Medications, such as antibiotics and steroids Uncontrolled diabetes Use of hygiene products such as bubble bath, vaginal spray or vaginal deodorant Douching Wearing damp or tight-fitting clothing Using an intrauterine device (IUD) for birth control Hormonal changes, such as those associated with pregnancy, birth control pills or menopause Sexual activity Having a sexually transmitted infection  Your treatment plan is Clindamycin vaginal cream 5 grams applied vaginally for 7 days.  I have electronically sent this prescription into the pharmacy that you have chosen.  Be sure to take all of the medication as directed. Stop taking any medication if you develop a rash, tongue swelling or shortness of breath. Mothers who are breast feeding should consider pumping and discarding their breast milk while on these antibiotics. However, there is no consensus that infant exposure at these doses would be harmful.  Remember  that medication creams can weaken latex condoms. Marland Kitchen   HOME CARE:  Good hygiene may prevent some types of vaginosis from recurring and may relieve some symptoms:  Avoid baths, hot tubs and whirlpool spas. Rinse soap from your outer genital area after a shower, and dry the area well to prevent irritation. Don't use scented or harsh soaps, such as those with deodorant or antibacterial action. Avoid irritants. These include scented tampons and pads. Wipe from front to back after using the toilet. Doing so avoids spreading fecal bacteria to your vagina.  Other things that may help prevent vaginosis include:  Don't douche. Your vagina doesn't require cleansing other than normal bathing. Repetitive douching disrupts the normal organisms that reside in the vagina and can actually increase your risk of vaginal infection. Douching won't clear up a vaginal infection. Use a latex condom. Both female and female latex condoms may help you avoid infections spread by sexual contact. Wear cotton underwear. Also wear pantyhose with a cotton crotch. If you feel comfortable without it, skip wearing underwear to bed. Yeast thrives in [employer] Your symptoms should improve in the next day or two.  GET HELP RIGHT AWAY IF:  You have pain in your lower abdomen ( pelvic area or over your ovaries) You develop nausea or vomiting You develop a fever Your discharge changes or worsens You have persistent pain with intercourse You develop shortness of breath, a rapid pulse, or you faint.  These symptoms could be signs of problems or infections that need to be evaluated by a medical provider now.  MAKE SURE YOU   Understand these instructions. Will watch your condition. Will get help right  away if you are not doing well or get worse.  Thank you for choosing an e-visit.  Your e-visit answers were reviewed by a board certified advanced clinical practitioner to complete your personal care plan. Depending  upon the condition, your plan could have included both over the counter or prescription medications.  Please review your pharmacy choice. Make sure the pharmacy is open so you can pick up prescription now. If there is a problem, you may contact your provider through MyChart messaging and have the prescription routed to another pharmacy.  Your safety is important to us. If you have drug allergies check your prescription carefully.   For the next 24 hours you can use MyChart to ask questions about today's visit, request a non-urgent call back, or ask for a work or school excuse. You will get an email in the next two days asking about your experience. I hope that your e-visit has been valuable and will speed your recovery.  I have spent 5 minutes in review of e-visit questionnaire, review and updating patient chart, medical decision making and response to patient.   Tyren Dugar M Charle Clear, PA-C  

## 2023-09-08 ENCOUNTER — Telehealth: Payer: BC Managed Care – PPO | Admitting: Physician Assistant

## 2023-09-08 DIAGNOSIS — R3989 Other symptoms and signs involving the genitourinary system: Secondary | ICD-10-CM

## 2023-09-08 MED ORDER — CEPHALEXIN 500 MG PO CAPS: 500.0000 mg | ORAL_CAPSULE | Freq: Two times a day (BID) | ORAL | 0 refills | Status: AC

## 2023-09-08 NOTE — Progress Notes (Signed)
I have spent 5 minutes in review of e-visit questionnaire, review and updating patient chart, medical decision making and response to patient.   Mia Milan Cody Jacklynn Dehaas, PA-C    

## 2023-09-08 NOTE — Progress Notes (Signed)

## 2023-09-15 ENCOUNTER — Ambulatory Visit: Payer: BC Managed Care – PPO

## 2023-09-15 DIAGNOSIS — R0981 Nasal congestion: Secondary | ICD-10-CM | POA: Diagnosis not present

## 2023-09-15 DIAGNOSIS — J020 Streptococcal pharyngitis: Secondary | ICD-10-CM | POA: Diagnosis not present

## 2023-09-16 ENCOUNTER — Telehealth: Payer: BC Managed Care – PPO | Admitting: Family Medicine

## 2023-09-16 ENCOUNTER — Telehealth: Payer: BC Managed Care – PPO

## 2023-09-16 DIAGNOSIS — R3989 Other symptoms and signs involving the genitourinary system: Secondary | ICD-10-CM

## 2023-09-16 MED ORDER — CEPHALEXIN 500 MG PO CAPS
500.0000 mg | ORAL_CAPSULE | Freq: Two times a day (BID) | ORAL | 0 refills | Status: AC
Start: 2023-09-16 — End: 2023-09-21

## 2023-09-16 NOTE — Progress Notes (Signed)
E-Visit for Urinary Problems  We are sorry that you are not feeling well.  Here is how we plan to help!  Based on what you shared with me it looks like you most likely have a simple urinary tract infection.  A UTI (Urinary Tract Infection) is a bacterial infection of the bladder. We have [facility] to [facility]  [address]  You can have [facility] transferred to [facility] near your location.   Most cases of urinary tract infections are simple to treat but a key part of your care is to encourage you to drink plenty of fluids and watch your symptoms carefully.  I have prescribed Keflex 500 mg twice a day for 7 days.  Your symptoms should gradually improve. Call us if the burning in your urine worsens, you develop worsening fever, back pain or pelvic pain or if your symptoms do not resolve after completing the antibiotic.  Urinary tract infections can be prevented by drinking plenty of water to keep your body hydrated.  Also be sure when you wipe, wipe from front to back and don't hold it in!  If possible, empty your bladder every 4 hours.  HOME CARE Drink plenty of fluids Compete the full course of the antibiotics even if the symptoms resolve Remember, when you need to go.go. Holding in your urine can increase the likelihood of getting a UTI! GET HELP RIGHT AWAY IF: You cannot urinate You get a high fever Worsening back pain occurs You see blood in your urine You feel sick to your stomach or throw up You feel like you are going to pass out  MAKE SURE YOU  Understand these instructions. Will watch your condition. Will get help right away if you are not doing well or get worse.   Thank you for choosing an e-visit.  Your e-visit answers were reviewed by a board certified advanced clinical practitioner to complete your personal care plan. Depending upon the condition, your plan could have included both over the counter or prescription medications.  Please review your pharmacy choice.  Make sure the pharmacy is open so you can pick up prescription now. If there is a problem, you may contact your provider through [employer] and have the prescription routed to another pharmacy.  Your safety is important to [place]. If you have drug allergies check your prescription carefully.   For the next 24 hours you can use MyChart to ask questions about today's visit, request a non-urgent call back, or ask for a work or school excuse. You will get an email in the next two days asking about your experience. I hope that your e-visit has been valuable and will speed your recovery.  I have spent 5 minutes in review of e-visit questionnaire, review and updating patient chart, medical decision making and response to patient.   Reed Pandy, PA-C

## 2023-09-16 NOTE — Progress Notes (Signed)
This is a duplicate request

## 2023-09-25 ENCOUNTER — Telehealth: Payer: BC Managed Care – PPO | Admitting: Family Medicine

## 2023-09-25 DIAGNOSIS — B3731 Acute candidiasis of vulva and vagina: Secondary | ICD-10-CM

## 2023-09-25 MED ORDER — FLUCONAZOLE 150 MG PO TABS: 150.0000 mg | ORAL_TABLET | Freq: Once | ORAL | 0 refills | Status: DC

## 2023-09-25 MED ORDER — FLUCONAZOLE 150 MG PO TABS: 150.0000 mg | ORAL_TABLET | Freq: Once | ORAL | 1 refills | Status: AC

## 2023-09-25 NOTE — Progress Notes (Signed)

## 2023-11-11 ENCOUNTER — Encounter: Payer: BC Managed Care – PPO | Admitting: Urology

## 2023-11-17 DIAGNOSIS — R300 Dysuria: Secondary | ICD-10-CM | POA: Diagnosis not present

## 2023-11-17 DIAGNOSIS — N760 Acute vaginitis: Secondary | ICD-10-CM | POA: Diagnosis not present

## 2023-11-17 DIAGNOSIS — Z113 Encounter for screening for infections with a predominantly sexual mode of transmission: Secondary | ICD-10-CM | POA: Diagnosis not present

## 2023-11-18 ENCOUNTER — Encounter: Payer: BC Managed Care – PPO | Admitting: Urology

## 2023-12-05 ENCOUNTER — Telehealth: Admitting: Nurse Practitioner

## 2023-12-05 DIAGNOSIS — H60333 Swimmer's ear, bilateral: Secondary | ICD-10-CM | POA: Diagnosis not present

## 2023-12-05 MED ORDER — AMOXICILLIN-POT CLAVULANATE 875-125 MG PO TABS
1.0000 | ORAL_TABLET | Freq: Two times a day (BID) | ORAL | 0 refills | Status: AC
Start: 2023-12-05 — End: 2023-12-15

## 2023-12-05 NOTE — Progress Notes (Signed)
E Visit for Ear Pain - Swimmer's Ear  We are sorry that you are not feeling well. Here is how we plan to help!  Based on what you have shared with me it looks like you have Swimmer's Ear.  Swimmer's ear is a redness or swelling, irritation, or infection of your outer ear canal. These symptoms usually occur within a few days of swimming. Your ear canal is a tube that goes from the opening of the ear to the eardrum.  When water stays in your ear canal, germs can grow.  This is a painful condition that often happens to children and swimmers of all ages.  It is not contagious and oral antibiotics are not required to treat uncomplicated swimmer's ear.  The usual symptoms include:    Itchiness inside the ear  Redness or a sense of swelling in the ear  Pain when the ear is tugged on when pressure is placed on the ear  Pus draining from the infected ear   I have prescribed Augmentin 875-125 mg one tablet by mouth twice a day for 10 days   In certain cases, swimmer's ear may progress to a more serious bacterial infection of the middle or inner ear.  If you have a fever 102 and up and significantly worsening symptoms, this could indicate a more serious infection moving to the middle/inner and needs face to face evaluation in an office by a provider.  Your symptoms should improve over the next 3 days and should resolve in about 7 days.  Be sure to complete ALL of your prescription.  HOME CARE: Wash your hands frequently. If you are prescribed an ear drop, do not place the tip of the bottle on your ear or touch it with your fingers. You can take Acetaminophen 650 mg every 4-6 hours as needed for pain.  If pain is severe or moderate, you can apply a heating pad (set on low) or hot water bottle (wrapped in a towel) to outer ear for 20 minutes.  This will also increase drainage. Avoid ear plugs Do not go swimming until the symptoms are gone Do not use Q-tips After showers, help the water run out by  tilting your head to one side.   GET HELP RIGHT AWAY IF: Fever is over 102.2 degrees. You develop progressive ear pain or hearing loss. Ear symptoms persist longer than 3 days after treatment.  MAKE SURE YOU: Understand these instructions. Will watch your condition. Will get help right away if you are not doing well or get worse.  TO PREVENT SWIMMER'S EAR: Use a bathing cap or custom fitted swim molds to keep your ears dry. Towel off after swimming to dry your ears. Tilt your head or pull your earlobes to allow the water to escape your ear canal. If there is still water in your ears, consider using a hairdryer on the lowest setting.  Thank you for choosing an e-visit.  Your e-visit answers were reviewed by a board certified advanced clinical practitioner to complete your personal care plan. Depending upon the condition, your plan could have included both over the counter or prescription medications.  Please review your pharmacy choice. Make sure the pharmacy is open so you can pick up the prescription now. If there is a problem, you may contact your provider through [employer] and have the prescription routed to another pharmacy.  Your safety is important to [place]. If you have drug allergies check your prescription carefully.   For the next  24 hours you can use MyChart to ask questions about today's visit, request a non-urgent call back, or ask for a work or school excuse. You will get an email with a survey after your eVisit asking about your experience. We would appreciate your feedback. I hope that your e-visit has been valuable and will aid in your recovery.

## 2023-12-05 NOTE — Progress Notes (Signed)
 I have spent 5 minutes in review of e-visit questionnaire, review and updating patient chart, medical decision making and response to patient.   Claiborne Rigg, NP

## 2023-12-11 ENCOUNTER — Other Ambulatory Visit: Payer: Self-pay

## 2023-12-11 DIAGNOSIS — N390 Urinary tract infection, site not specified: Secondary | ICD-10-CM

## 2023-12-14 ENCOUNTER — Encounter: Payer: BC Managed Care – PPO | Admitting: Urology

## 2023-12-18 ENCOUNTER — Encounter: Payer: Self-pay | Admitting: Urology

## 2023-12-18 ENCOUNTER — Other Ambulatory Visit
Admission: RE | Admit: 2023-12-18 | Discharge: 2023-12-18 | Disposition: A | Discharge status: Home / Self Care | Attending: Urology | Admitting: Urology

## 2023-12-18 ENCOUNTER — Ambulatory Visit (INDEPENDENT_AMBULATORY_CARE_PROVIDER_SITE_OTHER): Payer: BC Managed Care – PPO | Admitting: Urology

## 2023-12-18 VITALS — BP 117/82 | HR 91 | Ht 60.0 in | Wt 115.2 lb

## 2023-12-18 DIAGNOSIS — N390 Urinary tract infection, site not specified: Secondary | ICD-10-CM | POA: Insufficient documentation

## 2023-12-18 DIAGNOSIS — R3915 Urgency of urination: Secondary | ICD-10-CM | POA: Diagnosis not present

## 2023-12-18 DIAGNOSIS — B070 Plantar wart: Secondary | ICD-10-CM | POA: Insufficient documentation

## 2023-12-18 DIAGNOSIS — R300 Dysuria: Secondary | ICD-10-CM | POA: Insufficient documentation

## 2023-12-18 LAB — URINALYSIS, COMPLETE (UACMP) WITH MICROSCOPIC
Bilirubin Urine: NEGATIVE
Glucose, UA: NEGATIVE mg/dL
Leukocytes,Ua: NEGATIVE
Nitrite: NEGATIVE
Protein, ur: 30 mg/dL — AB
Specific Gravity, Urine: 1.02 (ref 1.005–1.030)
pH: 7.5 (ref 5.0–8.0)

## 2023-12-18 LAB — CHLAMYDIA/NGC RT PCR (ARMC ONLY)
Chlamydia Tr: NOT DETECTED
N gonorrhoeae: NOT DETECTED

## 2023-12-18 MED ORDER — ESTRADIOL 0.1 MG/GM VA CREA: TOPICAL_CREAM | VAGINAL | 12 refills | Status: AC

## 2023-12-18 NOTE — Progress Notes (Signed)
 Marcelle Overlie Plume,acting as a scribe for Vanna Scotland, MD.,have documented all relevant documentation on the behalf of Vanna Scotland, MD,as directed by  Vanna Scotland, MD while in the presence of Vanna Scotland, MD.  12/18/23 12:59 PM   Tamara Wilcox March 10, 2001 [id number]  Referring provider: Pearline Cables, MD [address] [place],  [place] [address]  Chief Complaint  Patient presents with   Establish Care   Dysuria    HPI: 23 y/o female self-referred for presumed UTIs and recurrent bacterial vaginosis (BV). She has been treated empirically via virtual visits without urine culture data. Over the past year, she had one urine culture on 04/2023 showing a low colony count of E. coli.   She underwent a CT abdomen pelvis with contrast on 01/2023 at [facility] for right lower quadrant pain, which showed normal kidneys without stones, and an IUD was seen (copper).  She reports persistent painful urination despite negative tests for UTIs, BV, mycoplasma, and ureaplasma over the past six months.  She mentions that a lot of [facility] [place] [facility] which is not available to [place].  She reports that 1 time she was diagnosed with mycoplasma and Ureaplasma which was treated.  Her partner presumably was also tested and she has not had a recurrence of this even recently.  In the past few months, she continues to have symptoms but has been told that testing is negative.  She describes the pain as internal, located in the urethra, and sometimes accompanied by back pain on the left side.   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   She is sexually active and has been tested for STDs, with negative results. She has tried various UTI prevention methods, including cranberry supplements and probiotics, without relief.   PMH: Past Medical History:  Diagnosis Date   Allergic rhinitis 04/24/2016   Anxiety     Attention deficit disorder with hyperactivity(314.01) 09/02/2013   Gastroesophageal reflux disease 01/02/2016   History of recurrent ear infection 11/15/2020   Oppositional defiant disorder 10/29/2015   Recurrent epistaxis 11/15/2020    Surgical History: Past Surgical History:  Procedure Laterality Date   HERNIA REPAIR     TONSILLECTOMY AND ADENOIDECTOMY     TYMPANOSTOMY TUBE PLACEMENT      Home Medications:  Allergies as of 12/18/2023       Reactions   Avocado    Peanuts [peanut Oil]    hives   Shrimp (diagnostic) Other (See Comments)   vomiting        Medication List        Accurate as of December 18, 2023 12:59 PM. If you have any questions, ask your nurse or doctor.          Adderall XR 25 MG 24 hr capsule Generic drug: amphetamine-dextroamphetamine Take 25 mg by mouth every morning.   EpiPen 2-Pak 0.3 MG/0.3ML Soaj injection Generic drug: EPINEPHrine Inject 0.3 mg into the muscle as needed for anaphylaxis.   estradiol 0.1 MG/GM vaginal cream Commonly known as: ESTRACE Estrogen Cream Instruction Discard applicator Apply pea sized amount to tip of finger to urethra before bed. Wash hands well after application. Use Monday, Wednesday and Friday   montelukast 10 MG tablet Commonly known as: SINGULAIR Take 1 tablet (10 mg total) by mouth daily.   paragard intrauterine copper Iud IUD Provided by care center   ZyrTEC  Allergy 10 MG Caps Generic drug: Cetirizine HCl Take by mouth.        Allergies:  Allergies  Allergen Reactions   Avocado    Peanuts [Peanut Oil]     hives   Shrimp (Diagnostic) Other (See Comments)    vomiting    Family History: Family History  Problem Relation Age of Onset   Thyroid disease Mother    GER disease Mother    Hypertension Father    GER disease Father    Thyroid disease Maternal Grandmother    Osteoporosis Maternal Grandmother    Heart attack Maternal Grandfather    Cancer - Lung Paternal Grandmother    Heart  Problems Paternal Grandmother    Cancer - Colon Paternal Grandfather    Cancer Paternal Aunt     Social History:  reports that she has never smoked. She has never used smokeless tobacco. She reports that she does not drink alcohol and does not use drugs.   Physical Exam: BP 117/82   Pulse 91   Ht 5' (1.524 m)   Wt 115 lb 4 oz (52.3 kg)   BMI 22.51 kg/m   Constitutional:  Alert and oriented, No acute distress. HEENT: [place] AT, moist mucus membranes.  Trachea midline, no masses. Neurologic: Grossly intact, no focal deficits, moving all 4 extremities. Psychiatric: Normal mood and affect.   Assessment & Plan:    1. Recurrent Urinary Tract Symptoms - Differential diagnosis includes interstitial cystitis (IC), urethral abnormalities, or inflammatory urethritis.  - Given the negative urine cultures and persistent symptoms, IC is a consideration.  - A urethral abnormality is less likely due to the absence of recurrent infections or discharge. - Urine sample for culture, mycoplasma, and ureaplasma testing.  - Initiate topical estrogen cream to be applied to the urethra three times a week to address potential inflammatory urethritis.  - Schedule a follow-up appointment in six weeks for a pelvic exam and possible cystoscopy to evaluate for IC or anatomical abnormalities-she has never had a pelvic exam before and is very nervous about this idea.  If she improves with the above treatment, she can cancel this appointment. - Consider prescribing Valium for anxiety management during the procedure if needed.  2. History of Bacterial Vaginosis - She reports no recent episodes of BV, and recent tests have been negative. - Continue monitoring for symptoms. Encourage the use of probiotics to maintain a healthy microbiome balance.  Return in about 6 weeks (around 01/29/2024) for Cystoscopy/ pelvic exam.   [facility] [address] [place], [place] [address] [phone]

## 2023-12-18 NOTE — Patient Instructions (Addendum)
 For UTI prevention take cranberry tablets, probiotic, D-Mannose daily.   Cystoscopy Cystoscopy is a procedure that is used to help diagnose and sometimes treat conditions that affect the lower urinary tract. The lower urinary tract includes the bladder and the urethra. The urethra is the tube that drains urine from the bladder. Cystoscopy is done using a thin, tube-shaped instrument with a light and camera at the end (cystoscope). The cystoscope may be hard or flexible, depending on the goal of the procedure. The cystoscope is inserted through the urethra, into the bladder. Cystoscopy may be recommended if you have: Urinary tract infections that keep coming back. Blood in the urine (hematuria). An inability to control when you urinate (urinary incontinence) or an overactive bladder. Unusual cells found in a urine sample. A blockage in the urethra, such as a urinary stone. Painful urination. An abnormality in the bladder found during an intravenous pyelogram (IVP) or CT scan. What are the risks? Generally, this is a safe procedure. However, problems may occur, including: Infection. Bleeding.  What happens during the procedure?  You will be given one or more of the following: A medicine to numb the area (local anesthetic). The area around the opening of your urethra will be cleaned. The cystoscope will be passed through your urethra into your bladder. Germ-free (sterile) fluid will flow through the cystoscope to fill your bladder. The fluid will stretch your bladder so that your health care provider can clearly examine your bladder walls. Your doctor will look at the urethra and bladder. The cystoscope will be removed The procedure may vary among health care providers  What can I expect after the procedure? After the procedure, it is common to have: Some soreness or pain in your urethra. Urinary symptoms. These include: Mild pain or burning when you urinate. Pain should stop within a few  minutes after you urinate. This may last for up to a few days after the procedure. A small amount of blood in your urine for several days. Feeling like you need to urinate but producing only a small amount of urine. Follow these instructions at home: General instructions Return to your normal activities as told by your health care provider.  Drink plenty of fluids after the procedure. Keep all follow-up visits as told by your health care provider. This is important. Contact a health care provider if you: Have pain that gets worse or does not get better with medicine, especially pain when you urinate lasting longer than 72 hours after the procedure. Have trouble urinating. Get help right away if you: Have blood clots in your urine. Have a fever or chills. Are unable to urinate. Summary Cystoscopy is a procedure that is used to help diagnose and sometimes treat conditions that affect the lower urinary tract. Cystoscopy is done using a thin, tube-shaped instrument with a light and camera at the end. After the procedure, it is common to have some soreness or pain in your urethra. It is normal to have blood in your urine after the procedure.  If you were prescribed an antibiotic medicine, take it as told by your health care provider.  This information is not intended to replace advice given to you by your health care provider. Make sure you discuss any questions you have with your health care provider. Document Revised: 08/31/2018 Document Reviewed: 08/31/2018 Elsevier Patient Education  2020 [employer].

## 2023-12-19 LAB — URINE CULTURE: Culture: <10000 — AB

## 2023-12-22 LAB — MISC LABCORP TEST (SEND OUT): Labcorp test code: 86884

## 2023-12-25 DIAGNOSIS — H6091 Unspecified otitis externa, right ear: Secondary | ICD-10-CM | POA: Diagnosis not present

## 2023-12-26 ENCOUNTER — Telehealth: Admitting: Nurse Practitioner

## 2023-12-26 DIAGNOSIS — H6990 Unspecified Eustachian tube disorder, unspecified ear: Secondary | ICD-10-CM

## 2023-12-26 MED ORDER — ZYRTEC ALLERGY 10 MG PO CAPS: 10.0000 mg | ORAL_CAPSULE | Freq: Every day | ORAL | 0 refills | Status: DC

## 2023-12-26 MED ORDER — IPRATROPIUM BROMIDE 0.03 % NA SOLN: 2.0000 | Freq: Two times a day (BID) | NASAL | 0 refills | Status: AC

## 2023-12-26 NOTE — Progress Notes (Signed)
 As we have recently prescribed you antibiotics for ear infection we will refill your allergy medication and send a steroid nasal spray for your ear pain today. If you feel antibiotics are warranted please reach out to your PCP on Monday or be seen face to face for examination of your ears at urgent care or a student health center.   E-Visit for Ear Pain - Eustachian Tube Dysfunction   We are sorry that you are not feeling well. Here is how we plan to help!  Based on what you have shared with me it looks like you have Eustachian Tube Dysfunction.  Eustachian Tube Dysfunction is a condition where the tubes that connect your middle ears to your upper throat become blocked. This can lead to discomfort, hearing difficulties and a feeling of fullness in your ear. Eustachian tube dysfunction usually resolves itself in a few days. The usual symptoms include: Hearing problems Tinnitus, or ringing in your ears Clicking or popping sounds A feeling of fullness in your ears Pain that mimics an ear infection Dizziness, vertigo or balance problems A "tickling" sensation in your ears  ?Eustachian tube dysfunction symptoms may get worse in higher altitudes. This is called barotrauma, and it can happen while scuba diving, flying in an airplane or driving in the mountains.   What causes eustachian tube dysfunction? Allergies and infections (like the common cold and the flu) are the most common causes of eustachian tube dysfunction. These conditions can cause inflammation and mucus buildup, leading to blockage. GERD, or chronic acid reflux, can also cause ETD. This is because stomach acid can back up into your throat and result in inflammation. As mentioned above, altitude changes can also cause ETD.   What are some common eustachian tube dysfunction treatments? In most cases, treatment isn't necessary because ETD often resolves on its own. However, you might need treatment if your symptoms linger for more than  two weeks.    Eustachian tube dysfunction treatment depends on the cause and the severity of your condition. Treatments may include home remedies, medications or, in severe cases, surgery.     HOME CARE: Sometimes simple home remedies can help with mild cases of eustachian tube dysfunction. To try and clear the blockage, you can: Chew gum. Yawn. Swallow. Try the Valsalva maneuver (breathing out forcefully while closing your mouth and pinching your nostrils). Use a saline spray to clear out nasal passages.  MEDICATIONS: Over-the-counter medications can help if allergies are causing eustachian tube dysfunction. Try antihistamines (like cetirizine or diphenhydramine) to ease your symptoms. If you have discomfort, pain relievers -- such as acetaminophen or ibuprofen -- can help.  Sometimes intranasal glucocorticosteroids (like Flonase or Nasacort) help.  I have prescribed Ipratropium Bromide nasal spray 0.03% two sprays in each nostril 2-3 times a day and zyrtec    GET HELP RIGHT AWAY IF: Fever is over 102.2 degrees. You develop progressive ear pain or hearing loss. Ear symptoms persist longer than 3 days after treatment.  MAKE SURE YOU: Understand these instructions. Will watch your condition. Will get help right away if you are not doing well or get worse.  Thank you for choosing an e-visit.  Your e-visit answers were reviewed by a board certified advanced clinical practitioner to complete your personal care plan. Depending upon the condition, your plan could have included both over the counter or prescription medications.  Please review your pharmacy choice. Make sure the pharmacy is open so you can pick up the prescription now. If there is  a problem, you may contact your provider through [employer] and have the prescription routed to another pharmacy.  Your safety is important to [place]. If you have drug allergies check your prescription carefully.   For the next 24 hours you can  use MyChart to ask questions about today's visit, request a non-urgent call back, or ask for a work or school excuse. You will get an email with a survey after your eVisit asking about your experience. We would appreciate your feedback. I hope that your e-visit has been valuable and will aid in your recovery.

## 2023-12-26 NOTE — Progress Notes (Signed)
 I have spent 5 minutes in review of e-visit questionnaire, review and updating patient chart, medical decision making and response to patient.   Claiborne Rigg, NP

## 2023-12-27 DIAGNOSIS — H6091 Unspecified otitis externa, right ear: Secondary | ICD-10-CM | POA: Diagnosis not present

## 2023-12-29 ENCOUNTER — Other Ambulatory Visit: Payer: Self-pay

## 2023-12-29 DIAGNOSIS — A493 Mycoplasma infection, unspecified site: Secondary | ICD-10-CM

## 2023-12-29 MED ORDER — DOXYCYCLINE HYCLATE 100 MG PO CAPS: 100.0000 mg | ORAL_CAPSULE | Freq: Two times a day (BID) | ORAL | 0 refills | Status: DC

## 2024-01-01 DIAGNOSIS — M25512 Pain in left shoulder: Secondary | ICD-10-CM | POA: Diagnosis not present

## 2024-01-01 DIAGNOSIS — S43492A Other sprain of left shoulder joint, initial encounter: Secondary | ICD-10-CM | POA: Diagnosis not present

## 2024-01-03 ENCOUNTER — Encounter: Payer: Self-pay | Admitting: Urology

## 2024-01-05 MED ORDER — FLUCONAZOLE 150 MG PO TABS: 150.0000 mg | ORAL_TABLET | Freq: Once | ORAL | 1 refills | Status: AC

## 2024-01-05 MED ORDER — DOXYCYCLINE HYCLATE 100 MG PO CAPS
100.0000 mg | ORAL_CAPSULE | Freq: Two times a day (BID) | ORAL | 0 refills | Status: DC
Start: 2024-01-05 — End: 2024-02-03

## 2024-01-24 ENCOUNTER — Encounter: Payer: Self-pay | Admitting: Urology

## 2024-01-27 MED ORDER — DIAZEPAM 10 MG PO TABS: 10.0000 mg | ORAL_TABLET | Freq: Once | ORAL | 0 refills | Status: AC

## 2024-02-03 ENCOUNTER — Ambulatory Visit (INDEPENDENT_AMBULATORY_CARE_PROVIDER_SITE_OTHER): Admitting: Urology

## 2024-02-03 VITALS — BP 127/87 | HR 83

## 2024-02-03 DIAGNOSIS — Z09 Encounter for follow-up examination after completed treatment for conditions other than malignant neoplasm: Secondary | ICD-10-CM

## 2024-02-03 DIAGNOSIS — A493 Mycoplasma infection, unspecified site: Secondary | ICD-10-CM | POA: Diagnosis not present

## 2024-02-03 DIAGNOSIS — N390 Urinary tract infection, site not specified: Secondary | ICD-10-CM

## 2024-02-03 DIAGNOSIS — Z87440 Personal history of urinary (tract) infections: Secondary | ICD-10-CM

## 2024-02-03 LAB — URINALYSIS, COMPLETE
Bilirubin, UA: NEGATIVE
Glucose, UA: NEGATIVE
Ketones, UA: NEGATIVE
Leukocytes,UA: NEGATIVE
Nitrite, UA: NEGATIVE
Protein,UA: NEGATIVE
RBC, UA: NEGATIVE
Specific Gravity, UA: 1.02 (ref 1.005–1.030)
Urobilinogen, Ur: 0.2 mg/dL (ref 0.2–1.0)
pH, UA: 6.5 (ref 5.0–7.5)

## 2024-02-03 LAB — MICROSCOPIC EXAMINATION
Epithelial Cells (non renal): >10 /HPF — AB (ref 0–10)
RBC, Urine: NONE SEEN /HPF (ref 0–2)

## 2024-02-03 NOTE — Progress Notes (Signed)
   02/03/24  CC:  Chief Complaint  Patient presents with   Cysto    HPI: 23 year old female with chronic intermittent dysuria found to have both mycoplasma and Ureaplasma UTI.  She was treated with a course of doxycycline , improved while taking this medication but then about a week later, developed recurrent symptoms.  She still having these today.  UA today is negative.  Blood pressure 127/87, pulse 83. NED. A&Ox3.   No respiratory distress   Abd soft, NT, ND Normal external genitalia with patent urethral meatus.  No notable vaginal discharge.  Cystoscopy Procedure Note  Patient identification was confirmed, informed consent was obtained, and patient was prepped using Betadine solution.  Lidocaine jelly was administered per urethral meatus.    Procedure: - Flexible cystoscope introduced, without any difficulty.   - Thorough search of the bladder revealed:    normal urethral meatus    normal urothelium    no stones    no ulcers     no tumors    no urethral polyps    no trabeculation  - Ureteral orifices were normal in position and appearance.  Very mild erythema at the bladder neck noted on retroflexion   Post-Procedure: - Patient tolerated the procedure well  Assessment/ Plan:  1. Mycoplasma/Ureaplasma infection Given ongoing recurrent symptoms, will treat again empirically with another round of doxycycline  which she already has and recent cultures  If her symptoms improve and then recur again, may consider second line therapy.  She will message me and let me know how things go. - Mycoplasma / ureaplasma culture  2. Recurrent UTI (Primary) As above - Urinalysis, Complete - Mycoplasma / ureaplasma culture    Dustin Gimenez, MD

## 2024-02-09 ENCOUNTER — Encounter: Payer: Self-pay | Admitting: Urology

## 2024-02-09 LAB — MYCOPLASMA / UREAPLASMA CULTURE
Mycoplasma hominis Culture: NEGATIVE
Ureaplasma urealyticum: NEGATIVE

## 2024-02-19 ENCOUNTER — Other Ambulatory Visit: Admitting: Urology

## 2024-03-07 ENCOUNTER — Telehealth: Payer: Self-pay | Admitting: Family Medicine

## 2024-03-07 NOTE — Telephone Encounter (Signed)
Placed in PCP folder.

## 2024-03-07 NOTE — Telephone Encounter (Signed)
 Pt mom dropped off records for pcp. Placed records in pcp's box

## 2024-03-09 ENCOUNTER — Telehealth: Admitting: Physician Assistant

## 2024-03-09 DIAGNOSIS — B3731 Acute candidiasis of vulva and vagina: Secondary | ICD-10-CM

## 2024-03-09 MED ORDER — FLUCONAZOLE 150 MG PO TABS: ORAL_TABLET | ORAL | 0 refills | Status: DC

## 2024-03-09 NOTE — Progress Notes (Signed)
 I have spent 5 minutes in review of e-visit questionnaire, review and updating patient chart, medical decision making and response to patient.   Piedad Climes, PA-C

## 2024-03-09 NOTE — Progress Notes (Signed)

## 2024-03-15 DIAGNOSIS — J069 Acute upper respiratory infection, unspecified: Secondary | ICD-10-CM | POA: Diagnosis not present

## 2024-03-15 DIAGNOSIS — R0981 Nasal congestion: Secondary | ICD-10-CM | POA: Diagnosis not present

## 2024-03-15 DIAGNOSIS — H9203 Otalgia, bilateral: Secondary | ICD-10-CM | POA: Diagnosis not present

## 2024-03-15 DIAGNOSIS — J029 Acute pharyngitis, unspecified: Secondary | ICD-10-CM | POA: Diagnosis not present

## 2024-03-15 DIAGNOSIS — R519 Headache, unspecified: Secondary | ICD-10-CM | POA: Diagnosis not present

## 2024-03-19 NOTE — Patient Instructions (Incomplete)
 It was good to see you again today!  Please let me know if you would like to go ahead with the heart monitor I will be in touch with your labs asap  Please let me know when you need more refills in about 3 months, recheck in 6 months (virtual ok!)

## 2024-03-19 NOTE — Progress Notes (Unsigned)
 [facility] [address], [address] [place], [place] [address] [phone]  Date:  03/21/2024   Name:  MIRABELLA HILARIO   DOB:  [birth year]   MRN:  [id number]  PCP:  Watt Harlene BROCKS, MD    Chief Complaint: No chief complaint on file.   History of Present Illness:  Dalia L Elmendorf is a 23 y.o. very pleasant female patient who presents with the following:  Patient seen today to discuss ADHD treatment She has been my patient for several years but have been getting her stimulant from another provider, she wondered if I could take this over for her.  She provided records from her previous stimulant provider  Patient Active Problem List   Diagnosis Date Noted   Plantar wart of both feet 12/18/2023   Acne vulgaris 08/12/2021   Lesion of skin of right ear 08/12/2021   Viral warts 08/12/2021   Sinus tachycardia 08/12/2021   Cardiac murmur 08/12/2021   Asthma 02/05/2021   Allergic rhinitis due to animal hair and dander 11/27/2020   Chronic allergic conjunctivitis 11/27/2020   Recurrent epistaxis 11/15/2020   Allergic rhinitis 04/24/2016   Gastroesophageal reflux disease 01/02/2016   Oppositional defiant disorder 10/29/2015   Anxiety 10/29/2015   Attention deficit hyperactivity disorder (ADHD) 09/02/2013    Past Medical History:  Diagnosis Date   Allergic rhinitis 04/24/2016   Anxiety    Attention deficit disorder with hyperactivity(314.01) 09/02/2013   Gastroesophageal reflux disease 01/02/2016   History of recurrent ear infection 11/15/2020   Oppositional defiant disorder 10/29/2015   Recurrent epistaxis 11/15/2020    Past Surgical History:  Procedure Laterality Date   HERNIA REPAIR     TONSILLECTOMY AND ADENOIDECTOMY     TYMPANOSTOMY TUBE PLACEMENT      Social History   Tobacco Use   Smoking status: Never   Smokeless tobacco: Never  Substance Use Topics   Alcohol use: No    Alcohol/week: 0.0 standard drinks of  alcohol   Drug use: No    Family History  Problem Relation Age of Onset   Thyroid  disease Mother    GER disease Mother    Hypertension Father    GER disease Father    Thyroid  disease Maternal Grandmother    Osteoporosis Maternal Grandmother    Heart attack Maternal Grandfather    Cancer - Lung Paternal Grandmother    Heart Problems Paternal Grandmother    Cancer - Colon Paternal Grandfather    Cancer Paternal Aunt     Allergies  Allergen Reactions   Avocado    Peanuts [Peanut Oil]     hives   Shrimp (Diagnostic) Other (See Comments)    vomiting    Medication list has been reviewed and updated.  Current Outpatient Medications on File Prior to Visit  Medication Sig Dispense Refill   amphetamine-dextroamphetamine (ADDERALL XR) 25 MG 24 hr capsule Take 25 mg by mouth every morning.     EPIPEN 2-PAK 0.3 MG/0.3ML SOAJ injection Inject 0.3 mg into the muscle as needed for anaphylaxis.  1   estradiol  (ESTRACE ) 0.1 MG/GM vaginal cream Estrogen Cream Instruction Discard applicator Apply pea sized amount to tip of finger to urethra before bed. Wash hands well after application. Use Monday, Wednesday and Friday 42.5 g 12   fluconazole  (DIFLUCAN ) 150 MG tablet Take 1 tablet PO once. Repeat in 3 days if needed. 2 tablet 0   ipratropium (ATROVENT ) 0.03 % nasal spray Place 2  sprays into both nostrils every 12 (twelve) hours. 30 mL 0   paragard intrauterine copper IUD IUD Provided by care center     No current facility-administered medications on file prior to visit.    Review of Systems:  As per HPI- otherwise negative.   Physical Examination: There were no vitals filed for this visit. There were no vitals filed for this visit. There is no height or weight on file to calculate BMI. Ideal Body Weight:    GEN: no acute distress. HEENT: Atraumatic, Normocephalic.  Ears and Nose: No external deformity. CV: RRR, No M/G/R. No JVD. No thrill. No extra heart sounds. PULM: CTA B, no  wheezes, crackles, rhonchi. No retractions. No resp. distress. No accessory muscle use. ABD: S, NT, ND, +BS. No rebound. No HSM. EXTR: No c/c/e PSYCH: Normally interactive. Conversant.    Assessment and Plan: ***  Signed Harlene Schroeder, MD

## 2024-03-21 ENCOUNTER — Encounter: Payer: Self-pay | Admitting: Family Medicine

## 2024-03-21 ENCOUNTER — Ambulatory Visit (INDEPENDENT_AMBULATORY_CARE_PROVIDER_SITE_OTHER): Admitting: Family Medicine

## 2024-03-21 VITALS — BP 112/68 | HR 94 | Ht 60.0 in | Wt 115.0 lb

## 2024-03-21 DIAGNOSIS — F900 Attention-deficit hyperactivity disorder, predominantly inattentive type: Secondary | ICD-10-CM

## 2024-03-21 DIAGNOSIS — R002 Palpitations: Secondary | ICD-10-CM | POA: Diagnosis not present

## 2024-03-21 LAB — BASIC METABOLIC PANEL WITH GFR
BUN: 13 mg/dL (ref 6–23)
CO2: 27 meq/L (ref 19–32)
Calcium: 9.5 mg/dL (ref 8.4–10.5)
Chloride: 103 meq/L (ref 96–112)
Creatinine, Ser: 0.87 mg/dL (ref 0.40–1.20)
GFR: 95.66 mL/min (ref >60.00)
Glucose, Bld: 94 mg/dL (ref 70–99)
Potassium: 4.4 meq/L (ref 3.5–5.1)
Sodium: 141 meq/L (ref 135–145)

## 2024-03-21 LAB — CBC
HCT: 38.6 % (ref 36.0–46.0)
Hemoglobin: 12.1 g/dL (ref 12.0–15.0)
MCHC: 31.3 g/dL (ref 30.0–36.0)
MCV: 77 fl — ABNORMAL LOW (ref 78.0–100.0)
Platelets: 465 10*3/uL — ABNORMAL HIGH (ref 150.0–400.0)
RBC: 5.01 Mil/uL (ref 3.87–5.11)
RDW: 17.4 % — ABNORMAL HIGH (ref 11.5–14.6)
WBC: 8.4 10*3/uL (ref 4.5–10.5)

## 2024-03-21 LAB — TSH: TSH: 1.25 u[IU]/mL (ref 0.35–5.50)

## 2024-03-21 MED ORDER — AMPHETAMINE-DEXTROAMPHET ER 20 MG PO CP24: 20.0000 mg | ORAL_CAPSULE | ORAL | 0 refills | Status: AC

## 2024-03-21 MED ORDER — AMPHETAMINE-DEXTROAMPHETAMINE 10 MG PO TABS: 10.0000 mg | ORAL_TABLET | Freq: Every day | ORAL | 0 refills | Status: DC | PRN

## 2024-04-06 DIAGNOSIS — Z01419 Encounter for gynecological examination (general) (routine) without abnormal findings: Secondary | ICD-10-CM | POA: Diagnosis not present

## 2024-04-06 DIAGNOSIS — N760 Acute vaginitis: Secondary | ICD-10-CM | POA: Diagnosis not present

## 2024-04-06 DIAGNOSIS — R300 Dysuria: Secondary | ICD-10-CM | POA: Diagnosis not present

## 2024-04-06 DIAGNOSIS — Z6820 Body mass index (BMI) 20.0-20.9, adult: Secondary | ICD-10-CM | POA: Diagnosis not present

## 2024-04-06 DIAGNOSIS — Z113 Encounter for screening for infections with a predominantly sexual mode of transmission: Secondary | ICD-10-CM | POA: Diagnosis not present

## 2024-06-10 ENCOUNTER — Encounter: Payer: Self-pay | Admitting: Family Medicine

## 2024-06-10 DIAGNOSIS — R002 Palpitations: Secondary | ICD-10-CM

## 2024-06-13 ENCOUNTER — Ambulatory Visit: Attending: Family Medicine

## 2024-06-13 DIAGNOSIS — R002 Palpitations: Secondary | ICD-10-CM

## 2024-06-13 NOTE — Progress Notes (Unsigned)
 EP to read.

## 2024-06-13 NOTE — Addendum Note (Signed)
 Addended by: WATT RAISIN C on: 06/13/2024 12:22 PM   Modules accepted: Orders

## 2024-06-17 DIAGNOSIS — A389 Scarlet fever, uncomplicated: Secondary | ICD-10-CM | POA: Diagnosis not present

## 2024-06-17 DIAGNOSIS — J029 Acute pharyngitis, unspecified: Secondary | ICD-10-CM | POA: Diagnosis not present

## 2024-06-17 DIAGNOSIS — J020 Streptococcal pharyngitis: Secondary | ICD-10-CM | POA: Diagnosis not present

## 2024-06-21 ENCOUNTER — Telehealth: Admitting: Physician Assistant

## 2024-06-21 DIAGNOSIS — B379 Candidiasis, unspecified: Secondary | ICD-10-CM

## 2024-06-21 DIAGNOSIS — T3695XA Adverse effect of unspecified systemic antibiotic, initial encounter: Secondary | ICD-10-CM | POA: Diagnosis not present

## 2024-06-21 MED ORDER — FLUCONAZOLE 150 MG PO TABS: ORAL_TABLET | ORAL | 0 refills | Status: DC

## 2024-06-21 NOTE — Progress Notes (Signed)

## 2024-07-08 DIAGNOSIS — R300 Dysuria: Secondary | ICD-10-CM | POA: Diagnosis not present

## 2024-07-08 DIAGNOSIS — N390 Urinary tract infection, site not specified: Secondary | ICD-10-CM | POA: Diagnosis not present

## 2024-07-08 DIAGNOSIS — R030 Elevated blood-pressure reading, without diagnosis of hypertension: Secondary | ICD-10-CM | POA: Diagnosis not present

## 2024-07-08 DIAGNOSIS — N760 Acute vaginitis: Secondary | ICD-10-CM | POA: Diagnosis not present

## 2024-07-19 DIAGNOSIS — R002 Palpitations: Secondary | ICD-10-CM | POA: Diagnosis not present

## 2024-07-21 DIAGNOSIS — R002 Palpitations: Secondary | ICD-10-CM | POA: Diagnosis not present

## 2024-07-22 ENCOUNTER — Encounter: Payer: Self-pay | Admitting: Family Medicine

## 2024-08-18 DIAGNOSIS — J020 Streptococcal pharyngitis: Secondary | ICD-10-CM | POA: Diagnosis not present

## 2024-08-18 DIAGNOSIS — J029 Acute pharyngitis, unspecified: Secondary | ICD-10-CM | POA: Diagnosis not present

## 2024-09-01 ENCOUNTER — Telehealth: Admitting: Physician Assistant

## 2024-09-01 DIAGNOSIS — B379 Candidiasis, unspecified: Secondary | ICD-10-CM

## 2024-09-01 DIAGNOSIS — T3695XA Adverse effect of unspecified systemic antibiotic, initial encounter: Secondary | ICD-10-CM | POA: Diagnosis not present

## 2024-09-01 MED ORDER — FLUCONAZOLE 150 MG PO TABS: 150.0000 mg | ORAL_TABLET | ORAL | 0 refills | Status: AC | PRN

## 2024-09-01 NOTE — Progress Notes (Signed)

## 2024-10-19 ENCOUNTER — Encounter: Payer: Self-pay | Admitting: Family Medicine

## 2024-10-19 ENCOUNTER — Telehealth (INDEPENDENT_AMBULATORY_CARE_PROVIDER_SITE_OTHER): Admitting: Family Medicine

## 2024-10-19 DIAGNOSIS — F900 Attention-deficit hyperactivity disorder, predominantly inattentive type: Secondary | ICD-10-CM | POA: Diagnosis not present

## 2024-10-19 MED ORDER — AMPHETAMINE-DEXTROAMPHETAMINE 10 MG PO TABS: 10.0000 mg | ORAL_TABLET | Freq: Every day | ORAL | 0 refills | Status: AC | PRN

## 2024-10-19 NOTE — Progress Notes (Signed)
 [facility] [address] [place], [place] [address] [phone]  Date:  10/19/2024   Name:  Tamara Wilcox   DOB:  [birth year]   MRN:  [id number]  PCP:  Watt Harlene BROCKS, MD    Chief Complaint: Medication Refill (Refill Adderall )   History of Present Illness:  Tamara Wilcox is a 24 y.o. very pleasant female patient who presents with the following:  Patient seen today for a virtual visit to discuss ADHD.  I saw her most recently at the end of June Notes from that visit: History of vasovagal presyncope, she was previously evaluated by cardiology and released to full activity Also history of allergies, anxiety and ADHD   She has been my patient for several years but have been getting her stimulant from another provider, she wondered if I could take this over for her.  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  She may become an EEG tech  Her ADD provider moved to [place] so we will take over her treatment She does well with her adderall ER 20 mg once daily- does not take on the weekend She will occasionally take a 5 mg IR if she needs to complete a task on the weekend-last filled about a year ago.  Patient notes she actually typically takes 2 of them or 10 mg.    Patient location is home, location is office.  Patient identity 4 with 2 factors, she gives consent for virtual visit today.  The patient and myself are present on the visit today  Discussed the use of AI scribe software for clinical note transcription with the patient, who gave verbal consent to proceed.  History of Present Illness Tamara Wilcox is a 24 year old female who presents for medication management of ADHD.  She is currently using Adderall ER 20 mg but has been utilizing the 10mg  immediate release formulation  more frequently. The immediate release is more suitable for her current needs as she is not preparing for board exams and does not require extended periods of focus. She finds the immediate release formulation to be less taxing on her body.  She typically takes one 10 mg immediate release tablet per day, occasionally breaking it in half. She prefers the flexibility of the 10 mg dose over taking two 5 mg tablets.  She is in the final stages of her education, studying to become an Music Therapist.  She has not excessive side effects from the medication, notes that her sleeping, eating is fine    Patient Active Problem List   Diagnosis Date Noted   Plantar wart of both feet 12/18/2023   Acne vulgaris 08/12/2021   Lesion of skin of right ear 08/12/2021   Viral warts 08/12/2021   Sinus tachycardia 08/12/2021   Cardiac murmur 08/12/2021   Asthma 02/05/2021   Allergic rhinitis due to animal hair and dander 11/27/2020   Chronic allergic conjunctivitis 11/27/2020   Recurrent epistaxis 11/15/2020   Allergic rhinitis 04/24/2016   Gastroesophageal reflux disease 01/02/2016   Oppositional defiant disorder 10/29/2015   Anxiety 10/29/2015   Attention deficit hyperactivity disorder (ADHD) 09/02/2013    Past Medical History:  Diagnosis Date   Allergic rhinitis 04/24/2016   Anxiety    Attention deficit disorder with hyperactivity(314.01) 09/02/2013  Gastroesophageal reflux disease 01/02/2016   History of recurrent ear infection 11/15/2020   Oppositional defiant disorder 10/29/2015   Recurrent epistaxis 11/15/2020    Past Surgical History:  Procedure Laterality Date   HERNIA REPAIR     TONSILLECTOMY AND ADENOIDECTOMY     TYMPANOSTOMY TUBE PLACEMENT      Social History[1]  Family History  Problem Relation Age of Onset   Thyroid  disease Mother    GER disease Mother    Hypertension Father    GER disease Father    Thyroid  disease Maternal Grandmother    Osteoporosis Maternal Grandmother     Heart attack Maternal Grandfather    Cancer - Lung Paternal Grandmother    Heart Problems Paternal Grandmother    Cancer - Colon Paternal Grandfather    Cancer Paternal Aunt     Allergies[2]  Medication list has been reviewed and updated.  Medications Ordered Prior to Encounter[3]  Review of Systems:  As per HPI- otherwise negative.   Physical Examination: There were no vitals filed for this visit. There were no vitals filed for this visit. There is no height or weight on file to calculate BMI. Ideal Body Weight:    Pt observed via mychart video- she looks well, no SOB or distress is noted   Assessment and Plan: Attention deficit hyperactivity disorder (ADHD), predominantly inattentive type - Plan: amphetamine -dextroamphetamine  (ADDERALL) 10 MG tablet, amphetamine -dextroamphetamine  (ADDERALL) 10 MG tablet  Assessment & Plan Attention-deficit hyperactivity disorder, predominantly inattentive type Managed with Adderall ER 20 mg and immediate release Adderall 10 mg. Prefers immediate release for short study sessions due to less impact on sleep and appetite. Effective regimen. - Refilled Adderall immediate release 10 mg prescription. - Sent prescription to [facility] on [address] in [place].  Signed Harlene Schroeder, MD     [1]  Social History Tobacco Use   Smoking status: Never   Smokeless tobacco: Never  Substance Use Topics   Alcohol use: No    Alcohol/week: 0.0 standard drinks of alcohol   Drug use: No  [2]  Allergies Allergen Reactions   Avocado    Peanuts [Peanut Oil]     hives   Shrimp (Diagnostic) Other (See Comments)    vomiting  [3]  Current Outpatient Medications on File Prior to Visit  Medication Sig Dispense Refill   amphetamine -dextroamphetamine  (ADDERALL XR) 20 MG 24 hr capsule Take 1 capsule (20 mg total) by mouth every morning. 30 capsule 0   amphetamine -dextroamphetamine  (ADDERALL XR) 20 MG 24 hr capsule Take 1 capsule (20 mg total) by  mouth every morning. 30 capsule 0   amphetamine -dextroamphetamine  (ADDERALL XR) 20 MG 24 hr capsule Take 1 capsule (20 mg total) by mouth every morning. 30 capsule 0   amphetamine -dextroamphetamine  (ADDERALL XR) 25 MG 24 hr capsule Take 25 mg by mouth every morning.     EPIPEN 2-PAK 0.3 MG/0.3ML SOAJ injection Inject 0.3 mg into the muscle as needed for anaphylaxis. (Patient not taking: Reported on 03/21/2024)  1   estradiol  (ESTRACE ) 0.1 MG/GM vaginal cream Estrogen Cream Instruction Discard applicator Apply pea sized amount to tip of finger to urethra before bed. Wash hands well after application. Use Monday, Wednesday and Friday (Patient not taking: Reported on 03/21/2024) 42.5 g 12   fluconazole  (DIFLUCAN ) 150 MG tablet Take 1 tablet (150 mg total) by mouth every 3 (three) days as needed. 2 tablet 0   ipratropium (ATROVENT ) 0.03 % nasal spray Place 2 sprays into both nostrils every 12 (twelve) hours. (Patient not taking:  Reported on 03/21/2024) 30 mL 0   paragard intrauterine copper IUD IUD Provided by care center     No current facility-administered medications on file prior to visit.   "
